# Patient Record
Sex: Female | Born: 1945 | Race: White | Hispanic: No | Marital: Married | Smoking: Never smoker
Health system: Southern US, Community
[De-identification: ages and names within clinical notes are randomized; demographics above are authoritative.]

## PROBLEM LIST (undated history)

## (undated) DIAGNOSIS — K219 Gastro-esophageal reflux disease without esophagitis: Secondary | ICD-10-CM

## (undated) DIAGNOSIS — R2681 Unsteadiness on feet: Secondary | ICD-10-CM

## (undated) DIAGNOSIS — M1990 Unspecified osteoarthritis, unspecified site: Secondary | ICD-10-CM

## (undated) DIAGNOSIS — Z9289 Personal history of other medical treatment: Secondary | ICD-10-CM

## (undated) DIAGNOSIS — E119 Type 2 diabetes mellitus without complications: Secondary | ICD-10-CM

## (undated) DIAGNOSIS — Z9989 Dependence on other enabling machines and devices: Secondary | ICD-10-CM

## (undated) DIAGNOSIS — G4733 Obstructive sleep apnea (adult) (pediatric): Secondary | ICD-10-CM

## (undated) DIAGNOSIS — F32A Depression, unspecified: Secondary | ICD-10-CM

## (undated) DIAGNOSIS — T4145XA Adverse effect of unspecified anesthetic, initial encounter: Secondary | ICD-10-CM

## (undated) DIAGNOSIS — I10 Essential (primary) hypertension: Secondary | ICD-10-CM

## (undated) DIAGNOSIS — G43909 Migraine, unspecified, not intractable, without status migrainosus: Secondary | ICD-10-CM

## (undated) DIAGNOSIS — T8859XA Other complications of anesthesia, initial encounter: Secondary | ICD-10-CM

## (undated) DIAGNOSIS — R112 Nausea with vomiting, unspecified: Secondary | ICD-10-CM

## (undated) DIAGNOSIS — F329 Major depressive disorder, single episode, unspecified: Secondary | ICD-10-CM

## (undated) DIAGNOSIS — Z9889 Other specified postprocedural states: Secondary | ICD-10-CM

## (undated) DIAGNOSIS — F419 Anxiety disorder, unspecified: Secondary | ICD-10-CM

## (undated) DIAGNOSIS — F319 Bipolar disorder, unspecified: Secondary | ICD-10-CM

## (undated) DIAGNOSIS — D649 Anemia, unspecified: Secondary | ICD-10-CM

## (undated) HISTORY — PX: TOTAL KNEE ARTHROPLASTY: SHX125

## (undated) HISTORY — PX: CARPAL TUNNEL RELEASE: SHX101

## (undated) HISTORY — PX: CATARACT EXTRACTION W/ INTRAOCULAR LENS  IMPLANT, BILATERAL: SHX1307

## (undated) HISTORY — PX: BACK SURGERY: SHX140

## (undated) HISTORY — PX: CHOLECYSTECTOMY: SHX55

---
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
## 1965-06-12 HISTORY — PX: OVARIAN CYST REMOVAL: SHX89

## 1965-11-10 HISTORY — PX: APPENDECTOMY: SHX54

## 1976-06-12 HISTORY — PX: NASAL SINUS SURGERY: SHX719

## 1990-06-12 HISTORY — PX: BLADDER SUSPENSION: SHX72

## 1998-10-21 ENCOUNTER — Other Ambulatory Visit: Admission: RE | Admit: 1998-10-21 | Discharge: 1998-10-21 | Payer: Self-pay | Admitting: Obstetrics and Gynecology

## 1998-11-01 ENCOUNTER — Emergency Department (HOSPITAL_COMMUNITY): Admission: EM | Admit: 1998-11-01 | Discharge: 1998-11-01 | Payer: Self-pay | Admitting: Emergency Medicine

## 1998-11-05 ENCOUNTER — Ambulatory Visit (HOSPITAL_COMMUNITY): Admission: RE | Admit: 1998-11-05 | Discharge: 1998-11-05 | Payer: Self-pay | Admitting: Neurosurgery

## 1998-11-05 ENCOUNTER — Encounter: Payer: Self-pay | Admitting: Neurosurgery

## 1999-02-03 ENCOUNTER — Ambulatory Visit (HOSPITAL_COMMUNITY): Admission: RE | Admit: 1999-02-03 | Discharge: 1999-02-03 | Payer: Self-pay | Admitting: Neurosurgery

## 1999-02-08 ENCOUNTER — Ambulatory Visit (HOSPITAL_COMMUNITY): Admission: RE | Admit: 1999-02-08 | Discharge: 1999-02-08 | Payer: Self-pay | Admitting: Neurosurgery

## 1999-02-08 ENCOUNTER — Encounter: Payer: Self-pay | Admitting: Neurosurgery

## 1999-02-26 ENCOUNTER — Ambulatory Visit (HOSPITAL_COMMUNITY): Admission: RE | Admit: 1999-02-26 | Discharge: 1999-02-26 | Payer: Self-pay | Admitting: Neurosurgery

## 1999-02-26 ENCOUNTER — Encounter: Payer: Self-pay | Admitting: Neurosurgery

## 1999-03-15 ENCOUNTER — Ambulatory Visit (HOSPITAL_COMMUNITY): Admission: RE | Admit: 1999-03-15 | Discharge: 1999-03-15 | Payer: Self-pay | Admitting: Neurosurgery

## 1999-03-30 ENCOUNTER — Encounter: Payer: Self-pay | Admitting: *Deleted

## 1999-03-30 ENCOUNTER — Emergency Department (HOSPITAL_COMMUNITY): Admission: EM | Admit: 1999-03-30 | Discharge: 1999-03-30 | Payer: Self-pay | Admitting: *Deleted

## 1999-03-31 ENCOUNTER — Encounter: Payer: Self-pay | Admitting: *Deleted

## 1999-05-18 ENCOUNTER — Encounter: Payer: Self-pay | Admitting: Neurosurgery

## 1999-05-18 ENCOUNTER — Ambulatory Visit (HOSPITAL_COMMUNITY): Admission: RE | Admit: 1999-05-18 | Discharge: 1999-05-18 | Payer: Self-pay | Admitting: Neurosurgery

## 1999-11-08 ENCOUNTER — Other Ambulatory Visit: Admission: RE | Admit: 1999-11-08 | Discharge: 1999-11-08 | Payer: Self-pay | Admitting: Obstetrics and Gynecology

## 2000-05-23 ENCOUNTER — Encounter: Admission: RE | Admit: 2000-05-23 | Discharge: 2000-05-23 | Payer: Self-pay

## 2000-11-15 ENCOUNTER — Other Ambulatory Visit: Admission: RE | Admit: 2000-11-15 | Discharge: 2000-11-15 | Payer: Self-pay | Admitting: Obstetrics and Gynecology

## 2001-12-03 ENCOUNTER — Other Ambulatory Visit: Admission: RE | Admit: 2001-12-03 | Discharge: 2001-12-03 | Payer: Self-pay | Admitting: Obstetrics and Gynecology

## 2005-07-20 ENCOUNTER — Emergency Department (HOSPITAL_COMMUNITY): Admission: EM | Admit: 2005-07-20 | Discharge: 2005-07-20 | Payer: Self-pay | Admitting: Emergency Medicine

## 2008-03-18 ENCOUNTER — Encounter: Admission: RE | Admit: 2008-03-18 | Discharge: 2008-03-18 | Payer: Self-pay | Admitting: Orthopedic Surgery

## 2008-09-14 ENCOUNTER — Emergency Department (HOSPITAL_COMMUNITY): Admission: EM | Admit: 2008-09-14 | Discharge: 2008-09-14 | Payer: Self-pay | Admitting: Emergency Medicine

## 2010-05-19 ENCOUNTER — Emergency Department (HOSPITAL_COMMUNITY)
Admission: EM | Admit: 2010-05-19 | Discharge: 2010-05-19 | Payer: Self-pay | Source: Home / Self Care | Admitting: Emergency Medicine

## 2010-08-23 LAB — DIFFERENTIAL
Eosinophils Absolute: 0.3 10*3/uL (ref 0.0–0.7)
Monocytes Absolute: 0.6 10*3/uL (ref 0.1–1.0)
Monocytes Relative: 6 % (ref 3–12)
Neutro Abs: 6.7 10*3/uL (ref 1.7–7.7)
Neutrophils Relative %: 70 % (ref 43–77)

## 2010-08-23 LAB — BASIC METABOLIC PANEL
CO2: 25 mEq/L (ref 19–32)
Chloride: 105 mEq/L (ref 96–112)
Creatinine, Ser: 1.08 mg/dL (ref 0.4–1.2)
Glucose, Bld: 105 mg/dL — ABNORMAL HIGH (ref 70–99)
Potassium: 4.3 mEq/L (ref 3.5–5.1)

## 2010-08-23 LAB — PROTIME-INR
INR: 0.92 (ref 0.00–1.49)
Prothrombin Time: 12.6 seconds (ref 11.6–15.2)

## 2010-08-23 LAB — CBC
HCT: 38.4 % (ref 36.0–46.0)
Hemoglobin: 12.5 g/dL (ref 12.0–15.0)
MCH: 29.8 pg (ref 26.0–34.0)
MCHC: 32.6 g/dL (ref 30.0–36.0)

## 2012-03-28 ENCOUNTER — Encounter (HOSPITAL_COMMUNITY): Payer: Self-pay | Admitting: *Deleted

## 2012-03-28 ENCOUNTER — Emergency Department (HOSPITAL_COMMUNITY): Payer: Medicare Other

## 2012-03-28 ENCOUNTER — Observation Stay (HOSPITAL_COMMUNITY)
Admission: EM | Admit: 2012-03-28 | Discharge: 2012-03-30 | Disposition: A | Discharge status: Home / Self Care | Payer: Medicare Other | Attending: Internal Medicine | Admitting: Internal Medicine

## 2012-03-28 DIAGNOSIS — K219 Gastro-esophageal reflux disease without esophagitis: Secondary | ICD-10-CM | POA: Insufficient documentation

## 2012-03-28 DIAGNOSIS — G4733 Obstructive sleep apnea (adult) (pediatric): Secondary | ICD-10-CM | POA: Insufficient documentation

## 2012-03-28 DIAGNOSIS — E119 Type 2 diabetes mellitus without complications: Secondary | ICD-10-CM | POA: Insufficient documentation

## 2012-03-28 DIAGNOSIS — E669 Obesity, unspecified: Secondary | ICD-10-CM | POA: Insufficient documentation

## 2012-03-28 DIAGNOSIS — Z9989 Dependence on other enabling machines and devices: Secondary | ICD-10-CM

## 2012-03-28 DIAGNOSIS — Z96659 Presence of unspecified artificial knee joint: Secondary | ICD-10-CM | POA: Insufficient documentation

## 2012-03-28 DIAGNOSIS — Z79899 Other long term (current) drug therapy: Secondary | ICD-10-CM | POA: Insufficient documentation

## 2012-03-28 DIAGNOSIS — I10 Essential (primary) hypertension: Secondary | ICD-10-CM | POA: Insufficient documentation

## 2012-03-28 DIAGNOSIS — R079 Chest pain, unspecified: Principal | ICD-10-CM | POA: Insufficient documentation

## 2012-03-28 DIAGNOSIS — I517 Cardiomegaly: Secondary | ICD-10-CM | POA: Diagnosis present

## 2012-03-28 DIAGNOSIS — R0602 Shortness of breath: Secondary | ICD-10-CM | POA: Insufficient documentation

## 2012-03-28 DIAGNOSIS — Z23 Encounter for immunization: Secondary | ICD-10-CM | POA: Insufficient documentation

## 2012-03-28 HISTORY — DX: Adverse effect of unspecified anesthetic, initial encounter: T41.45XA

## 2012-03-28 HISTORY — DX: Gastro-esophageal reflux disease without esophagitis: K21.9

## 2012-03-28 HISTORY — DX: Bipolar disorder, unspecified: F31.9

## 2012-03-28 HISTORY — DX: Essential (primary) hypertension: I10

## 2012-03-28 HISTORY — DX: Other complications of anesthesia, initial encounter: T88.59XA

## 2012-03-28 HISTORY — DX: Unspecified osteoarthritis, unspecified site: M19.90

## 2012-03-28 HISTORY — DX: Nausea with vomiting, unspecified: Z98.890

## 2012-03-28 HISTORY — DX: Dependence on other enabling machines and devices: Z99.89

## 2012-03-28 HISTORY — DX: Obstructive sleep apnea (adult) (pediatric): G47.33

## 2012-03-28 HISTORY — DX: Nausea with vomiting, unspecified: R11.2

## 2012-03-28 LAB — BASIC METABOLIC PANEL
Calcium: 9.8 mg/dL (ref 8.4–10.5)
GFR calc Af Amer: 83 mL/min — ABNORMAL LOW (ref >90)
GFR calc non Af Amer: 72 mL/min — ABNORMAL LOW (ref >90)
Sodium: 132 mEq/L — ABNORMAL LOW (ref 135–145)

## 2012-03-28 LAB — CBC WITH DIFFERENTIAL/PLATELET
Basophils Absolute: 0 10*3/uL (ref 0.0–0.1)
Basophils Relative: 0 % (ref 0–1)
Eosinophils Relative: 3 % (ref 0–5)
HCT: 33.6 % — ABNORMAL LOW (ref 36.0–46.0)
MCHC: 33 g/dL (ref 30.0–36.0)
Monocytes Relative: 6 % (ref 3–12)
Neutrophils Relative %: 68 % (ref 43–77)
RBC: 3.9 MIL/uL (ref 3.87–5.11)
WBC: 8.6 10*3/uL (ref 4.0–10.5)

## 2012-03-28 LAB — CK TOTAL AND CKMB (NOT AT ARMC)
CK, MB: 4.4 ng/mL — ABNORMAL HIGH (ref 0.3–4.0)
Relative Index: 3 — ABNORMAL HIGH (ref 0.0–2.5)
Total CK: 147 U/L (ref 7–177)

## 2012-03-28 MED ORDER — ASPIRIN 81 MG PO CHEW
CHEWABLE_TABLET | ORAL | Status: AC
  Administered 2012-03-28: 324 mg
  Filled 2012-03-28: qty 4

## 2012-03-28 MED ORDER — OXCARBAZEPINE 300 MG PO TABS
600.0000 mg | ORAL_TABLET | Freq: Two times a day (BID) | ORAL | Status: DC
  Administered 2012-03-28 – 2012-03-30 (×4): 600 mg via ORAL
  Filled 2012-03-28 (×6): qty 2

## 2012-03-28 MED ORDER — ENOXAPARIN SODIUM 40 MG/0.4ML ~~LOC~~ SOLN
40.0000 mg | SUBCUTANEOUS | Status: DC
  Administered 2012-03-28 – 2012-03-29 (×2): 40 mg via SUBCUTANEOUS
  Filled 2012-03-28 (×3): qty 0.4

## 2012-03-28 MED ORDER — INFLUENZA VIRUS VACC SPLIT PF IM SUSP
0.5000 mL | INTRAMUSCULAR | Status: AC
  Administered 2012-03-29: 0.5 mL via INTRAMUSCULAR
  Filled 2012-03-28: qty 0.5

## 2012-03-28 MED ORDER — LOSARTAN POTASSIUM 50 MG PO TABS
100.0000 mg | ORAL_TABLET | Freq: Every day | ORAL | Status: DC
  Administered 2012-03-29 – 2012-03-30 (×2): 100 mg via ORAL
  Filled 2012-03-28 (×2): qty 2

## 2012-03-28 MED ORDER — NITROGLYCERIN 0.4 MG SL SUBL
SUBLINGUAL_TABLET | SUBLINGUAL | Status: AC
  Administered 2012-03-28 (×2)
  Filled 2012-03-28: qty 25

## 2012-03-28 MED ORDER — MORPHINE SULFATE 2 MG/ML IJ SOLN: 1.0000 mg | INTRAMUSCULAR | Status: DC | PRN

## 2012-03-28 MED ORDER — ACETAMINOPHEN 325 MG PO TABS
650.0000 mg | ORAL_TABLET | Freq: Four times a day (QID) | ORAL | Status: DC | PRN
  Administered 2012-03-29 – 2012-03-30 (×2): 650 mg via ORAL
  Filled 2012-03-28 (×2): qty 2

## 2012-03-28 MED ORDER — SALINE SPRAY 0.65 % NA SOLN
1.0000 | NASAL | Status: DC | PRN
  Administered 2012-03-28: 1 via NASAL
  Filled 2012-03-28: qty 44

## 2012-03-28 MED ORDER — SODIUM CHLORIDE 0.9 % IV SOLN
INTRAVENOUS | Status: AC
  Administered 2012-03-28: 22:00:00 via INTRAVENOUS

## 2012-03-28 MED ORDER — SODIUM CHLORIDE 0.9 % IJ SOLN
3.0000 mL | Freq: Two times a day (BID) | INTRAMUSCULAR | Status: DC
  Administered 2012-03-28 – 2012-03-30 (×4): 3 mL via INTRAVENOUS

## 2012-03-28 MED ORDER — ONDANSETRON HCL 4 MG PO TABS: 4.0000 mg | ORAL_TABLET | Freq: Four times a day (QID) | ORAL | Status: DC | PRN

## 2012-03-28 MED ORDER — NITROGLYCERIN 0.4 MG SL SUBL: 0.4000 mg | SUBLINGUAL_TABLET | SUBLINGUAL | Status: DC | PRN

## 2012-03-28 MED ORDER — INSULIN ASPART 100 UNIT/ML ~~LOC~~ SOLN: 0.0000 [IU] | Freq: Every day | SUBCUTANEOUS | Status: DC

## 2012-03-28 MED ORDER — LORATADINE 10 MG PO TABS
10.0000 mg | ORAL_TABLET | Freq: Every day | ORAL | Status: DC
  Administered 2012-03-29 – 2012-03-30 (×2): 10 mg via ORAL
  Filled 2012-03-28 (×2): qty 1

## 2012-03-28 MED ORDER — HYDRALAZINE HCL 20 MG/ML IJ SOLN
5.0000 mg | Freq: Four times a day (QID) | INTRAMUSCULAR | Status: DC | PRN
  Administered 2012-03-28: 5 mg via INTRAVENOUS
  Filled 2012-03-28: qty 1

## 2012-03-28 MED ORDER — METFORMIN HCL ER 750 MG PO TB24
750.0000 mg | ORAL_TABLET | Freq: Two times a day (BID) | ORAL | Status: DC
  Administered 2012-03-29 – 2012-03-30 (×4): 750 mg via ORAL
  Filled 2012-03-28 (×5): qty 1

## 2012-03-28 MED ORDER — ACETAMINOPHEN 650 MG RE SUPP: 650.0000 mg | Freq: Four times a day (QID) | RECTAL | Status: DC | PRN

## 2012-03-28 MED ORDER — PAROXETINE HCL 20 MG PO TABS
40.0000 mg | ORAL_TABLET | Freq: Every day | ORAL | Status: DC
  Administered 2012-03-29 – 2012-03-30 (×2): 40 mg via ORAL
  Filled 2012-03-28 (×2): qty 2

## 2012-03-28 MED ORDER — ONDANSETRON HCL 4 MG/2ML IJ SOLN: 4.0000 mg | Freq: Four times a day (QID) | INTRAMUSCULAR | Status: DC | PRN

## 2012-03-28 MED ORDER — CLONIDINE HCL 0.1 MG PO TABS
0.1000 mg | ORAL_TABLET | Freq: Two times a day (BID) | ORAL | Status: DC | PRN
  Administered 2012-03-28: 0.1 mg via ORAL
  Filled 2012-03-28: qty 1

## 2012-03-28 MED ORDER — INSULIN ASPART 100 UNIT/ML ~~LOC~~ SOLN: 0.0000 [IU] | Freq: Three times a day (TID) | SUBCUTANEOUS | Status: DC

## 2012-03-28 MED ORDER — PAROXETINE HCL 20 MG PO TABS: 40.0000 mg | ORAL_TABLET | ORAL | Status: DC

## 2012-03-28 MED ORDER — BISOPROLOL-HYDROCHLOROTHIAZIDE 5-6.25 MG PO TABS
1.0000 | ORAL_TABLET | Freq: Every day | ORAL | Status: DC
  Administered 2012-03-29 – 2012-03-30 (×2): 1 via ORAL
  Filled 2012-03-28 (×2): qty 1

## 2012-03-28 NOTE — ED Notes (Signed)
Pt states chest pain is diminished after having NTGs. Pt states her L arm is still a little numb. Pt states "I feel relaxed."

## 2012-03-28 NOTE — H&P (Signed)
[facility] History and Physical  BIRDIA JAYCOX UJW:[id number] DOB: 30-Dec-1945 DOA: 03/28/2012  Referring physician: [place] Sheldon, ER physician PCP: Lieutenant Diego, MD  Specialists: Alphonzo Severance, [facility] cardiology  Chief Complaint: Chest pain  HPI: Yvonne Peterson is a 66 y.o. female  With past medical history diabetes, hypertension and obesity who presents to the emergency room after having an episode of chest discomfort. Patient to her knowledge, has no previous history of any coronary artery disease. She says she was driving today [place] she had severe chest pressure like an elephant was sitting on her chest. She noted also some numbness that went up to her jaw on the left side as well as her left shoulder and down her left arm. She did not note any shortness of breath or dizziness. She felt the majority pressure was across her entire chest. This happened while she was driving so she drove home and discuss with her daughter and then into the emergency room. Next  In the emergency room she was evaluated and EKG showed no signs of any ST elevation or depression, but were signs of a possible old infarct in the inferior leads. Patient was given nitroglycerin and aspirin and her chest discomfort resolved. Lab work was done which was unremarkable. She is also noted to have elevated blood pressures with systolic in the 200s. Following treatment for chest pain, her pressures came down into the 160s. Hospitalists were called for further evaluation.  Review of Systems: When I saw the patient in the emergency room, she was doing okay. She denies any headaches, vision changes, dysphasia, chest pain currently, palpitations, shortness of breath, wheeze, cough, abdominal pain, hematuria, dysuria, constipation, diarrhea, focal extremity numbness weakness or pain. Review systems otherwise negative  Past Medical History  Diagnosis Date  . Complication of anesthesia     panic attacks  . PONV  (postoperative nausea and vomiting)   . Hypertension   . Bipolar affective   . GERD (gastroesophageal reflux disease)   . Diabetes mellitus without complication     Type 2  . Arthritis    Past Surgical History  Procedure Date  . Cataract extraction     left eye - Nov. 2012, right eye - Mar. 2013  . Knee arthroplasty     Left knee - Dec. 2010, right kee - March 2011  . Cholecystectomy approx. 1989  . Bladder tac 1992  . Appendectomy June 1967  . Tonsillectomy 1955  . Nasal sinus surgery 1978  . Ovarian cyst removal 1967  . Hand surgery approx. 2000    carpal tunnel - right hand   Social History:  reports that she has never smoked. She has never used smokeless tobacco. She reports that she drinks alcohol. She reports that she does not use illicit drugs. Patient lives at home with her husband. She is able to state and just about all activities of daily living  Allergies  Allergen Reactions  . Sulfa Antibiotics Anaphylaxis and Swelling    Swelling in nasal passage    Family history: Mother and father both had heart disease  Prior to Admission medications   Medication Sig Start Date End Date Taking? Authorizing Provider  bisoprolol-hydrochlorothiazide (ZIAC) 5-6.25 MG per tablet Take 1 tablet by mouth daily.   Yes Historical Provider, MD  cetirizine (ZYRTEC) 10 MG tablet Take 10 mg by mouth daily.   Yes Historical Provider, MD  losartan (COZAAR) 100 MG tablet Take 100 mg by mouth daily.   Yes Historical Provider, MD  metFORMIN (GLUCOPHAGE-XR) 750 MG 24 hr tablet Take 750 mg by mouth 2 (two) times daily.   Yes Historical Provider, MD  oxcarbazepine (TRILEPTAL) 600 MG tablet Take 600 mg by mouth 2 (two) times daily.   Yes Historical Provider, MD  PARoxetine (PAXIL) 40 MG tablet Take 40 mg by mouth every morning.   Yes Historical Provider, MD   Physical Exam: Filed Vitals:   03/28/12 1615 03/28/12 1622 03/28/12 1633 03/28/12 1918  BP: 208/95 191/101 156/84 163/97  Pulse:  75   66  Temp:  98.6 F (37 C)  98.7 F (37.1 C)  TempSrc:  Oral  Oral  Resp:  16  20  Height:  5\' 4"  (1.626 m)    Weight:  108.863 kg (240 lb)    SpO2:  97%  99%     General:  Alert and oriented x3, no acute distress, looks younger than stated age, fatigued  Eyes: Sclera nonicteric, extraocular movements are intact  ENT: Normocephalic the major medical mucous membranes are moist  Neck: Supple no JVD  Cardiovascular: Regular rate and rhythm, S1-S2, no appreciable murmurs rubs or gallops  Respiratory: Clear to auscultation bilaterally  Abdomen: Soft, obese, nontender, positive bowel sounds  Skin: No skin breaks, tears or lesions  Musculoskeletal: No clubbing or cyanosis or edema  Psychiatric: Patient is appropriate, no evidence of psychoses  Neurologic: No obvious deficits  Labs on Admission:  Basic Metabolic Panel:  Lab 03/28/12 4098  NA 132*  K 3.8  CL 95*  CO2 26  GLUCOSE 132*  BUN 18  CREATININE 0.82  CALCIUM 9.8  MG --  PHOS --   CBC:  Lab 03/28/12 1735  WBC 8.6  NEUTROABS 5.8  HGB 11.1*  HCT 33.6*  MCV 86.2  PLT 242   Cardiac Enzymes:  Lab 03/28/12 1735  CKTOTAL --  CKMB --  CKMBINDEX --  TROPONINI <0.30    BNP (last 3 results)  Basename 03/28/12 1735  PROBNP 67.8    Radiological Exams on Admission: Dg Chest Port 1 View  03/28/2012   IMPRESSION: Stable cardiomegaly.  No active lung disease.   Original Report Authenticated By: Danae Orleans, M.D.     EKG: Independently reviewed. 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 We'll cycle enzymes x3, lipid profile in the morning. I've contacted [facility] cardiology who will see the patient the morning for possible stress test or other.  Active Problems:  Diabetes mellitus type II, controlled: Continue metformin plus sliding-scale.  We will check an A1c   Malignant hypertension: Mildly elevated now. Continue home meds plus when necessary clonidine   Cardiomegaly: BNP normal on admission. We'll defer to cardiology about checking echo.   Obesity: Stable. Counseled.  Code Status: Full code  Family Communication: Case discussed with patient and her husband who is present at the bedside  Disposition Plan: Possible discharge home tomorrow after the evaluation  Time spent: 30 minutes  Hollice Espy [facility] Pager [phone]  If 7PM-7AM, please contact night-coverage [email or web address] Password [email or web address] 03/28/2012, 8:02 PM

## 2012-03-28 NOTE — ED Notes (Signed)
Bed:WA13<BR> Expected date:<BR> Expected time:<BR> Means of arrival:<BR> Comments:<BR> Triage 4 

## 2012-03-28 NOTE — ED Notes (Signed)
X-ray at bedside

## 2012-03-28 NOTE — ED Provider Notes (Signed)
History     CSN: [id number]  Arrival date & time 03/28/12  1601   First MD Initiated Contact with Patient 03/28/12 1719      Chief Complaint  Patient presents with  . Chest Pain  . Shortness of Breath    (Consider location/radiation/quality/duration/timing/severity/associated sxs/prior treatment) HPI Pt with no known CAD reports she had an episode of jaw pain, arm numbness and heaviness across her chest a short time prior to arrival. Associated with SOB. On arrival found to be hypertensive. Given ASA and NTG in triage with relief of pain. She is pain free now. She reports a similar episode about a week ago, also associated with HTN and worse with exertion that resolved spontaneously.   History reviewed. No pertinent past medical history.  Past Surgical History  Procedure Date  . Cataract extraction   . Cholecystectomy   . Knee arthroplasty     No family history on file.  History  Substance Use Topics  . Smoking status: Never Smoker   . Smokeless tobacco: Never Used  . Alcohol Use: No    OB History    Grav Para Term Preterm Abortions TAB SAB Ect Mult Living                  Review of Systems All other systems reviewed and are negative except as noted in HPI.   Allergies  Sulfa antibiotics  Home Medications   Current Outpatient Rx  Name Route Sig Dispense Refill  . BISOPROLOL-HYDROCHLOROTHIAZIDE 5-6.25 MG PO TABS Oral Take 1 tablet by mouth daily.    Marland Kitchen CETIRIZINE HCL 10 MG PO TABS Oral Take 10 mg by mouth daily.    Marland Kitchen LOSARTAN POTASSIUM 100 MG PO TABS Oral Take 100 mg by mouth daily.    Marland Kitchen METFORMIN HCL ER 750 MG PO TB24 Oral Take 750 mg by mouth 2 (two) times daily.    Marland Kitchen OXCARBAZEPINE 600 MG PO TABS Oral Take 600 mg by mouth 2 (two) times daily.    Marland Kitchen PAROXETINE HCL 40 MG PO TABS Oral Take 40 mg by mouth every morning.      BP 156/84  Pulse 75  Temp 98.6 F (37 C) (Oral)  Resp 16  Ht 5\' 4"  (1.626 m)  Wt 240 lb (108.863 kg)  BMI 41.20 kg/m2  SpO2  97%  Physical Exam  Nursing note and vitals reviewed. Constitutional: She is oriented to person, place, and time. She appears well-developed and well-nourished.  HENT:  Head: Normocephalic and atraumatic.  Eyes: EOM are normal. Pupils are equal, round, and reactive to light.  Neck: Normal range of motion. Neck supple.  Cardiovascular: Normal rate, normal heart sounds and intact distal pulses.   Pulmonary/Chest: Effort normal and breath sounds normal.  Abdominal: Bowel sounds are normal. She exhibits no distension. There is no tenderness.  Musculoskeletal: Normal range of motion. She exhibits no edema and no tenderness.  Neurological: She is alert and oriented to person, place, and time. She has normal strength. No cranial nerve deficit or sensory deficit.  Skin: Skin is warm and dry. No rash noted.  Psychiatric: She has a normal mood and affect.    ED Course  Procedures (including critical care time)  Labs Reviewed  CBC WITH DIFFERENTIAL - Abnormal; Notable for the following:    Hemoglobin 11.1 (*)     HCT 33.6 (*)     All other components within normal limits  BASIC METABOLIC PANEL - Abnormal; Notable for the following:  Sodium 132 (*)     Chloride 95 (*)     Glucose, Bld 132 (*)     GFR calc non Af Amer 72 (*)     GFR calc Af Amer 83 (*)     All other components within normal limits  TROPONIN I  PRO B NATRIURETIC PEPTIDE   Dg Chest Port 1 View  03/28/2012  *RADIOLOGY REPORT*  Clinical Data: Chest pain and shortness of breath.  PORTABLE CHEST - 1 VIEW  Comparison: 05/19/2010  Findings: Cardiomegaly is stable.  Both lungs are clear.  No evidence of pleural effusion.  IMPRESSION: Stable cardiomegaly.  No active lung disease.   Original Report Authenticated By: Danae Orleans, M.D.      No diagnosis found.    MDM   Date: 03/28/2012  Rate: 72  Rhythm: normal sinus rhythm  QRS Axis: normal  Intervals: normal  ST/T Wave abnormalities: nonspecific T wave changes   Conduction Disutrbances:none  Narrative Interpretation: inferior Q waves  Old EKG Reviewed: unchanged   Labs and imaging here unremarkable. Symptoms concerning for ACS. Admit for further eval.        Savahna Casados B. Bernette Mayers, MD 03/29/12 1346

## 2012-03-28 NOTE — ED Notes (Signed)
Pt from work with c/o chest pain that radiates to jaw and left arm and shortness of breath. sts she was at work when chest pain began. Sts she has had heaviness in her chest earlier this week as well associated with shob as well.

## 2012-03-28 NOTE — ED Notes (Signed)
Called to give report nurse unavailable will call back.  

## 2012-03-29 ENCOUNTER — Encounter (HOSPITAL_COMMUNITY): Payer: Self-pay | Admitting: *Deleted

## 2012-03-29 DIAGNOSIS — G4733 Obstructive sleep apnea (adult) (pediatric): Secondary | ICD-10-CM

## 2012-03-29 HISTORY — DX: Obstructive sleep apnea (adult) (pediatric): G47.33

## 2012-03-29 LAB — LIPID PANEL
HDL: 50 mg/dL (ref >39)
LDL Cholesterol: 92 mg/dL (ref 0–99)
Total CHOL/HDL Ratio: 3.4 RATIO
Triglycerides: 148 mg/dL (ref <150)
VLDL: 30 mg/dL (ref 0–40)

## 2012-03-29 LAB — GLUCOSE, CAPILLARY
Glucose-Capillary: 120 mg/dL — ABNORMAL HIGH (ref 70–99)
Glucose-Capillary: 106 mg/dL — ABNORMAL HIGH (ref 70–99)
Glucose-Capillary: 112 mg/dL — ABNORMAL HIGH (ref 70–99)

## 2012-03-29 LAB — CK TOTAL AND CKMB (NOT AT ARMC): Relative Index: 3.8 — ABNORMAL HIGH (ref 0.0–2.5)

## 2012-03-29 MED ORDER — HYDROCODONE-ACETAMINOPHEN 5-500 MG PO TABS: 1.0000 | ORAL_TABLET | Freq: Four times a day (QID) | ORAL | Status: DC | PRN

## 2012-03-29 MED ORDER — ISOSORBIDE MONONITRATE ER 30 MG PO TB24: 30.0000 mg | ORAL_TABLET | Freq: Every day | ORAL | Status: DC

## 2012-03-29 MED ORDER — ISOSORBIDE MONONITRATE ER 30 MG PO TB24
30.0000 mg | ORAL_TABLET | Freq: Every day | ORAL | Status: DC
  Administered 2012-03-29 – 2012-03-30 (×2): 30 mg via ORAL
  Filled 2012-03-29 (×2): qty 1

## 2012-03-29 NOTE — Care Management Note (Unsigned)
    Page 1 of 1   03/29/2012     2:00:23 PM   CARE MANAGEMENT NOTE 03/29/2012  Patient:  Yvonne Peterson, Yvonne Peterson   Account Number:  [id number]  Date Initiated:  03/29/2012  Documentation initiated by:  Lanier Clam  Subjective/Objective Assessment:   ADMITTED W/CHEST PAIN.     Action/Plan:   FROM HOME   Anticipated DC Date:  03/29/2012   Anticipated DC Plan:  HOME/SELF CARE      DC Planning Services  CM consult      Choice offered to / List presented to:             Status of service:  In process, will continue to follow Medicare Important Message given?   (If response is "NO", the following Medicare IM given date fields will be blank) Date Medicare IM given:   Date Additional Medicare IM given:    Discharge Disposition:    Per UR Regulation:  Reviewed for med. necessity/level of care/duration of stay  If discussed at Long Length of Stay Meetings, dates discussed:    Comments:  03/29/12 [facility] Katheen Aslin RN,BSN NCM [phone]

## 2012-03-29 NOTE — Consult Note (Signed)
Reason for Consult:chest pain in pt with diabetes  Referring Physician: Dr. Rito Ehrlich   PCP: Dr. Lieutenant Diego TERILYN Peterson is an 66 y.o. female.    Chief Complaint:  Chest pain  HPI: Yvonne Peterson is a 66 y.o. female with past medical history of diabetes, hypertension and obesity who presented to the emergency room after having an episode of chest discomfort. Patient to her knowledge, has no previous history of any coronary artery disease. She says she was driving yesterday, she had severe chest pressure like an elephant was sitting on her chest. She noted also some numbness that went up to her jaw on the left side as well as her left shoulder and down her left arm and across to her Rt. Arm. She did not note any shortness of breath or dizziness. She felt the majority pressure was across her entire chest. This happened while she was driving so she drove home and discussed with her daughter and then into the emergency room.  Last week she had a feeling like a boulder was sitting on her chest.  It did ease.  Recently with ambulation she has been DOE and would have to stop and rest.  In the emergency room she was evaluated and EKG showed no signs of any ST elevation or depression, but were signs of a possible old infarct in the inferior leads. Patient was given nitroglycerin and aspirin and her chest discomfort resolved. Lab work was done which was unremarkable. She is also noted to have elevated blood pressures with systolic in the 200s. Following treatment for chest pain, her pressures came down into the 160s. Hospitalists admitted the pt. To rule out MI.    Currently cardiac enzymes including troponin I is negative.  EKG Sinus brady with deep Q wave in III, deep twave in aVR that is unchanged from last pm.  No old EKGs to compare.   Last episode of chest discomfort was around 4-5 AM today, mild resolved quickly.  Pt. Does not prefer to go to [facility] would prefer [facility] where her husband is a  patient.  She does not have an appointment currently.  She would like to go home.  When asked if she could walk in the hall she said she had walked to the bathroom, but if she walked in the hall she would be SOB.  Past Medical History  Diagnosis Date  . Complication of anesthesia     panic attacks  . PONV (postoperative nausea and vomiting)   . Hypertension   . Bipolar affective   . GERD (gastroesophageal reflux disease)   . Diabetes mellitus without complication     Type 2  . Arthritis   . Sleep apnea   . OSA on CPAP 03/29/2012    Past Surgical History  Procedure Date  . Cataract extraction     left eye - Nov. 2012, right eye - Mar. 2013  . Knee arthroplasty     Left knee - Dec. 2010, right kee - March 2011  . Cholecystectomy approx. 1989  . Bladder tac 1992  . Appendectomy June 1967  . Tonsillectomy 1955  . Nasal sinus surgery 1978  . Ovarian cyst removal 1967  . Hand surgery approx. 2000    carpal tunnel - right hand    Family History  Problem Relation Age of Onset  . Coronary artery disease Mother   . Heart failure Mother   . Coronary artery disease Father   . Cancer Father   .  Cancer Brother    Social History:  reports that she has never smoked. She has never used smokeless tobacco. She reports that she drinks alcohol. She reports that she does not use illicit drugs. Married for 21 yrs.  Works outside the home.  Allergies:  Allergies  Allergen Reactions  . Sulfa Antibiotics Anaphylaxis and Swelling    Swelling in nasal passage    Medications Prior to Admission  Medication Sig Dispense Refill  . bisoprolol-hydrochlorothiazide (ZIAC) 5-6.25 MG per tablet Take 1 tablet by mouth daily.      . cetirizine (ZYRTEC) 10 MG tablet Take 10 mg by mouth daily.      Marland Kitchen losartan (COZAAR) 100 MG tablet Take 100 mg by mouth daily.      . metFORMIN (GLUCOPHAGE-XR) 750 MG 24 hr tablet Take 750 mg by mouth 2 (two) times daily.      Marland Kitchen oxcarbazepine (TRILEPTAL) 600 MG tablet  Take 600 mg by mouth 2 (two) times daily.      Marland Kitchen PARoxetine (PAXIL) 40 MG tablet Take 40 mg by mouth every morning.        Results for orders placed during the hospital encounter of 03/28/12 (from the past 48 hour(s))  CBC WITH DIFFERENTIAL     Status: Abnormal   Collection Time   03/28/12  5:35 PM      Component Value Range Comment   WBC 8.6  4.0 - 10.5 K/uL    RBC 3.90  3.87 - 5.11 MIL/uL    Hemoglobin 11.1 (*) 12.0 - 15.0 g/dL    HCT 16.1 (*) 09.6 - 46.0 %    MCV 86.2  78.0 - 100.0 fL    MCH 28.5  26.0 - 34.0 pg    MCHC 33.0  30.0 - 36.0 g/dL    RDW 04.5  40.9 - 81.1 %    Platelets 242  150 - 400 K/uL    Neutrophils Relative 68  43 - 77 %    Neutro Abs 5.8  1.7 - 7.7 K/uL    Lymphocytes Relative 23  12 - 46 %    Lymphs Abs 2.0  0.7 - 4.0 K/uL    Monocytes Relative 6  3 - 12 %    Monocytes Absolute 0.5  0.1 - 1.0 K/uL    Eosinophils Relative 3  0 - 5 %    Eosinophils Absolute 0.2  0.0 - 0.7 K/uL    Basophils Relative 0  0 - 1 %    Basophils Absolute 0.0  0.0 - 0.1 K/uL   BASIC METABOLIC PANEL     Status: Abnormal   Collection Time   03/28/12  5:35 PM      Component Value Range Comment   Sodium 132 (*) 135 - 145 mEq/L    Potassium 3.8  3.5 - 5.1 mEq/L    Chloride 95 (*) 96 - 112 mEq/L    CO2 26  19 - 32 mEq/L    Glucose, Bld 132 (*) 70 - 99 mg/dL    BUN 18  6 - 23 mg/dL    Creatinine, Ser 9.14  0.50 - 1.10 mg/dL    Calcium 9.8  8.4 - 78.2 mg/dL    GFR calc non Af Amer 72 (*) >90 mL/min    GFR calc Af Amer 83 (*) >90 mL/min   TROPONIN I     Status: Normal   Collection Time   03/28/12  5:35 PM      Component Value Range Comment  Troponin I <0.30  <0.30 ng/mL   PRO B NATRIURETIC PEPTIDE     Status: Normal   Collection Time   03/28/12  5:35 PM      Component Value Range Comment   Pro B Natriuretic peptide (BNP) 67.8  0 - 125 pg/mL   HEMOGLOBIN A1C     Status: Abnormal   Collection Time   03/28/12  5:35 PM      Component Value Range Comment   Hemoglobin A1C 7.0  (*) <5.7 %    Mean Plasma Glucose 154 (*) <117 mg/dL   GLUCOSE, CAPILLARY     Status: Normal   Collection Time   03/28/12  9:54 PM      Component Value Range Comment   Glucose-Capillary 89  70 - 99 mg/dL   CK TOTAL AND CKMB     Status: Abnormal   Collection Time   03/28/12 10:35 PM      Component Value Range Comment   Total CK 147  7 - 177 U/L    CK, MB 4.4 (*) 0.3 - 4.0 ng/mL    Relative Index 3.0 (*) 0.0 - 2.5   CK TOTAL AND CKMB     Status: Abnormal   Collection Time   03/29/12  6:05 AM      Component Value Range Comment   Total CK 103  7 - 177 U/L    CK, MB 3.9  0.3 - 4.0 ng/mL    Relative Index 3.8 (*) 0.0 - 2.5   LIPID PANEL     Status: Normal   Collection Time   03/29/12  6:05 AM      Component Value Range Comment   Cholesterol 172  0 - 200 mg/dL    Triglycerides 960  <454 mg/dL    HDL 50  >09 mg/dL    Total CHOL/HDL Ratio 3.4      VLDL 30  0 - 40 mg/dL    LDL Cholesterol 92  0 - 99 mg/dL   GLUCOSE, CAPILLARY     Status: Abnormal   Collection Time   03/29/12  8:06 AM      Component Value Range Comment   Glucose-Capillary 120 (*) 70 - 99 mg/dL   TROPONIN I     Status: Normal   Collection Time   03/29/12 12:00 PM      Component Value Range Comment   Troponin I <0.30  <0.30 ng/mL   GLUCOSE, CAPILLARY     Status: Abnormal   Collection Time   03/29/12 12:13 PM      Component Value Range Comment   Glucose-Capillary 106 (*) 70 - 99 mg/dL   CK TOTAL AND CKMB     Status: Abnormal   Collection Time   03/29/12  1:37 PM      Component Value Range Comment   Total CK 100  7 - 177 U/L    CK, MB 3.8  0.3 - 4.0 ng/mL    Relative Index 3.8 (*) 0.0 - 2.5    Dg Chest Port 1 View  03/28/2012  *RADIOLOGY REPORT*  Clinical Data: Chest pain and shortness of breath.  PORTABLE CHEST - 1 VIEW  Comparison: 05/19/2010  Findings: Cardiomegaly is stable.  Both lungs are clear.  No evidence of pleural effusion.  IMPRESSION: Stable cardiomegaly.  No active lung disease.   Original  Report Authenticated By: Danae Orleans, M.D.     ROS: General:no colds or fevers Skin:no rashes HEENT:no blurred vision CV:see HPI PUL:DOE  GI:no diarrhea or constipation no melena GU:no hematuria MS:no joint pain Neuro:no syncope  Endo:+ DM, pretty well controlled.   Blood pressure 126/82, pulse 93, temperature 98.9 F (37.2 C), temperature source Oral, resp. rate 18, height 5\' 4"  (1.626 m), weight 111.9 kg (246 lb 11.1 oz), SpO2 96.00%. PE: General:alert and oriented X 3, MAE, follows commands, pleasant affect Skin:Warm and dry, brisk capillary refill HEENT:normocephalic, sclarea clear Neck:supple no JVD, no bruits Heart:S1S2 RRR no obvious murmur, gallup or rub Lungs:clear without rales, rhonchi or wheezes Abd:+ BS, soft non tender Ext:no edema, 2 + pedal pulses bil Neuro:alert and oriented X 3, MAE, follows commands    Assessment/Plan Principal Problem:  *Chest pain Active Problems:  Diabetes mellitus type II, controlled  Malignant hypertension  Cardiomegaly  Obesity  OSA on CPAP  PLAN: Pt with angina, and multiple risk factors for CAD with DM, HTN, family history, obesitiy, sleep apnea and history of elevated cholesterol.  NTG and ASA resolved her chest pain.  Several years ago she was pt. Of Dr. Lucas Mallow he did do stress test several years ago.   Now with ongoing DOE and no CHF, possible anginal equivalent as well.  Cardiomegaly on CXR is stable. PCP's office is closed-unable to have old EKG.  Is on BB, add Imdur 30 mg daily.   Would like pt. To stay to discuss with Dr. Allyson Sabal.   INGOLD,LAURA R 03/29/2012, 3:38 PM   I have seen and examined the patient along with [facility] R, NP.  I have reviewed the chart, notes and new data.  I agree with NP's note.  Her symptoms are highly compelling for unstable angina. 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 Her enzymes are low risk. Her risk factor profile is high risk  PLAN: Recommend nuclear scintigraphy  (lexiscan, not treadmill). If abnormal or non-diagnostic, low threshold for coronary angiography. The procedure was discussed in detail.  Thurmon Fair, MD, [facility] [phone] 03/29/2012, 6:06 PM

## 2012-03-29 NOTE — Progress Notes (Signed)
Patient ID: Yvonne Peterson, female   DOB: 06-25-1945, 66 y.o.   MRN: [id number]  [facility] PROGRESS NOTE  LAKYSHA KOSSMAN WUJ:[id number] DOB: 1945/08/15 DOA: 03/28/2012 PCP: Lieutenant Diego, MD  Principal Problem:  *Chest pain - unclear etiology at this time but given risk factors will likely need stress test - will follow up on cardiology recommendations - continue to monitor on telemetry floor - provide supportive care  Active Problems:  Diabetes mellitus type II, controlled - trend CBG and readjust the regimen as indicated  Malignant hypertension - continue home medication regimen - added Imdur  Obesity - nutrition consultation obtained by me  OSA on CPAP - continue as per home regimen   Consultants:  Cardiology  Procedures/Studies: Dg Chest Port 1 View  03/28/2012  *RADIOLOGY REPORT*  Clinical Data: Chest pain and shortness of breath.  PORTABLE CHEST - 1 VIEW  Comparison: 05/19/2010  Findings: Cardiomegaly is stable.  Both lungs are clear.  No evidence of pleural effusion.  IMPRESSION: Stable cardiomegaly.  No active lung disease.   Original Report Authenticated By: Danae Orleans, M.D.     Antibiotics:  None  Code Status: Full Family Communication: Pt at bedside Disposition Plan: Home when medically stable  HPI/Subjective: No events overnight.   Objective: Filed Vitals:   03/29/12 0040 03/29/12 0530 03/29/12 1401 03/29/12 1424  BP:  133/67 126/54 126/82  Pulse: 68 55 62 93  Temp:  97.5 F (36.4 C) 97.9 F (36.6 C) 98.9 F (37.2 C)  TempSrc:  Oral Oral Oral  Resp: 18 16 18    Height:      Weight:      SpO2: 95% 96% 94% 96%    Intake/Output Summary (Last 24 hours) at 03/29/12 1808 Last data filed at 03/29/12 1719  Gross per 24 hour  Intake 668.67 ml  Output      0 ml  Net 668.67 ml    Exam:   General:  Pt is alert, follows commands appropriately, not in acute distress  Cardiovascular: Regular rate and rhythm, S1/S2, no murmurs, no rubs, no  gallops  Respiratory: Clear to auscultation bilaterally, no wheezing, no crackles, no rhonchi  Abdomen: Soft, non tender, non distended, bowel sounds present, no guarding  Extremities: No edema, pulses DP and PT palpable bilaterally  Neuro: Grossly nonfocal  Data Reviewed: Basic Metabolic Panel:  Lab 03/28/12 9562  NA 132*  K 3.8  CL 95*  CO2 26  GLUCOSE 132*  BUN 18  CREATININE 0.82  CALCIUM 9.8  MG --  PHOS --   Liver Function Tests: No results found for this basename: AST:5,ALT:5,ALKPHOS:5,BILITOT:5,PROT:5,ALBUMIN:5 in the last 168 hours No results found for this basename: LIPASE:5,AMYLASE:5 in the last 168 hours No results found for this basename: AMMONIA:5 in the last 168 hours CBC:  Lab 03/28/12 1735  WBC 8.6  NEUTROABS 5.8  HGB 11.1*  HCT 33.6*  MCV 86.2  PLT 242   Cardiac Enzymes:  Lab 03/29/12 1337 03/29/12 1200 03/29/12 0605 03/28/12 2235 03/28/12 1735  CKTOTAL 100 -- 103 147 --  CKMB 3.8 -- 3.9 4.4* --  CKMBINDEX -- -- -- -- --  TROPONINI -- <0.30 -- -- <0.30   BNP: No components found with this basename: POCBNP:5 CBG:  Lab 03/29/12 1213 03/29/12 0806 03/28/12 2154  GLUCAP 106* 120* 89    No results found for this or any previous visit (from the past 240 hour(s)).   Scheduled Meds:   . sodium chloride   Intravenous STAT  .  bisoprolol-hydrochlorothiazide  1 tablet Oral Daily  . enoxaparin (LOVENOX) injection  40 mg Subcutaneous Q24H  . influenza  inactive virus vaccine  0.5 mL Intramuscular Tomorrow-1000  . insulin aspart  0-5 Units Subcutaneous QHS  . insulin aspart  0-9 Units Subcutaneous TID WC  . isosorbide mononitrate  30 mg Oral Daily  . loratadine  10 mg Oral Daily  . losartan  100 mg Oral Daily  . metFORMIN  750 mg Oral BID WC  . oxcarbazepine  600 mg Oral BID  . PARoxetine  40 mg Oral Daily  . sodium chloride  3 mL Intravenous Q12H  . DISCONTD: PARoxetine  40 mg Oral BH-q7a   Continuous Infusions:    Debbora Presto,  MD  [facility] Pager [phone]  If 7PM-7AM, please contact night-coverage [email or web address] Password [email or web address] 03/29/2012, 6:08 PM   LOS: 1 day

## 2012-03-29 NOTE — Progress Notes (Signed)
Patient has sleep apnea and wears a CPAP machine at home. Before going to bed, respiratory put her on CPAP. Pt's oxygen dropped to 80 but when RN prompted patient to take a deep breath, O2 went back up to 95. RN will continue to monitor pt closely and she is sleeping in bed right now.

## 2012-03-30 ENCOUNTER — Observation Stay (HOSPITAL_COMMUNITY): Payer: Medicare Other

## 2012-03-30 LAB — BASIC METABOLIC PANEL WITH GFR
BUN: 21 mg/dL (ref 6–23)
CO2: 27 meq/L (ref 19–32)
Calcium: 9.1 mg/dL (ref 8.4–10.5)
Chloride: 95 meq/L — ABNORMAL LOW (ref 96–112)
Creatinine, Ser: 1.03 mg/dL (ref 0.50–1.10)
GFR calc Af Amer: 63 mL/min — ABNORMAL LOW (ref >90)
GFR calc non Af Amer: 55 mL/min — ABNORMAL LOW (ref >90)
Glucose, Bld: 130 mg/dL — ABNORMAL HIGH (ref 70–99)
Potassium: 4.7 meq/L (ref 3.5–5.1)
Sodium: 130 meq/L — ABNORMAL LOW (ref 135–145)

## 2012-03-30 LAB — CBC
HCT: 30.6 % — ABNORMAL LOW (ref 36.0–46.0)
Hemoglobin: 10.2 g/dL — ABNORMAL LOW (ref 12.0–15.0)
MCH: 28.7 pg (ref 26.0–34.0)
MCHC: 33.3 g/dL (ref 30.0–36.0)
MCV: 86 fL (ref 78.0–100.0)
Platelets: 236 K/uL (ref 150–400)
RBC: 3.56 MIL/uL — ABNORMAL LOW (ref 3.87–5.11)
RDW: 14.8 % (ref 11.5–15.5)
WBC: 8.8 K/uL (ref 4.0–10.5)

## 2012-03-30 LAB — GLUCOSE, CAPILLARY
Glucose-Capillary: 111 mg/dL — ABNORMAL HIGH (ref 70–99)
Glucose-Capillary: 103 mg/dL — ABNORMAL HIGH (ref 70–99)
Glucose-Capillary: 105 mg/dL — ABNORMAL HIGH (ref 70–99)

## 2012-03-30 MED ORDER — TECHNETIUM TC 99M SESTAMIBI GENERIC - CARDIOLITE
30.0000 | Freq: Once | INTRAVENOUS | Status: AC | PRN
  Administered 2012-03-30: 30 via INTRAVENOUS

## 2012-03-30 MED ORDER — TECHNETIUM TC 99M SESTAMIBI GENERIC - CARDIOLITE
10.0000 | Freq: Once | INTRAVENOUS | Status: AC | PRN
  Administered 2012-03-30: 10 via INTRAVENOUS

## 2012-03-30 MED ORDER — REGADENOSON 0.4 MG/5ML IV SOLN
0.4000 mg | Freq: Once | INTRAVENOUS | Status: AC
  Administered 2012-03-30: 0.4 mg via INTRAVENOUS
  Filled 2012-03-30: qty 5

## 2012-03-30 MED ORDER — HYDRALAZINE HCL 20 MG/ML IJ SOLN
10.0000 mg | Freq: Once | INTRAMUSCULAR | Status: AC
  Administered 2012-03-30: 10 mg via INTRAVENOUS

## 2012-03-30 NOTE — Progress Notes (Signed)
Pt sent to [place] via care link for stress test.

## 2012-03-30 NOTE — Progress Notes (Signed)
[facility]  Subjective: CP resolved During Lexiscan:  Felt relaxed with neck pain  Objective: Vital signs in last 24 hours: Temp:  [97.7 F (36.5 C)-98.9 F (37.2 C)] 97.7 F (36.5 C) (10/19 0511) Pulse Rate:  [52-93] 73  (10/19 1029) Resp:  [17-18] 17  (10/19 0900) BP: (118-209)/(54-82) 177/74 mmHg (10/19 1029) SpO2:  [94 %-96 %] 96 % (10/19 0511) Last BM Date: 03/28/12  Intake/Output from previous day: 10/18 0701 - 10/19 0700 In: 480 [P.O.:480] Out: -  Intake/Output this shift:    Medications Current Facility-Administered Medications  Medication Dose Route Frequency Provider Last Rate Last Dose  . acetaminophen (TYLENOL) tablet 650 mg  650 mg Oral Q6H PRN Hollice Espy, MD   650 mg at 03/29/12 1948   Or  . acetaminophen (TYLENOL) suppository 650 mg  650 mg Rectal Q6H PRN Hollice Espy, MD      . bisoprolol-hydrochlorothiazide [facility]) 5-6.25 MG per tablet 1 tablet  1 tablet Oral Daily Hollice Espy, MD   1 tablet at 03/29/12 1059  . cloNIDine (CATAPRES) tablet 0.1 mg  0.1 mg Oral BID PRN Hollice Espy, MD   0.1 mg at 03/28/12 2319  . enoxaparin (LOVENOX) injection 40 mg  40 mg Subcutaneous Q24H Hollice Espy, MD   40 mg at 03/29/12 2151  . hydrALAZINE (APRESOLINE) injection 10 mg  10 mg Intravenous Once Wilburt Finlay, PA      . hydrALAZINE (APRESOLINE) injection 5 mg  5 mg Intravenous Q6H PRN Rolan Lipa, NP   5 mg at 03/28/12 2227  . influenza  inactive virus vaccine (FLUZONE/FLUARIX) injection 0.5 mL  0.5 mL Intramuscular Tomorrow-1000 Charles B. Bernette Mayers, MD   0.5 mL at 03/29/12 1105  . insulin aspart (novoLOG) injection 0-5 Units  0-5 Units Subcutaneous QHS Hollice Espy, MD      . insulin aspart (novoLOG) injection 0-9 Units  0-9 Units Subcutaneous TID WC Hollice Espy, MD      . isosorbide mononitrate (IMDUR) 24 hr tablet 30 mg  30 mg Oral Daily Nada Boozer, NP   30 mg at 03/29/12 1634  . loratadine  (CLARITIN) tablet 10 mg  10 mg Oral Daily Hollice Espy, MD   10 mg at 03/29/12 1058  . losartan (COZAAR) tablet 100 mg  100 mg Oral Daily Hollice Espy, MD   100 mg at 03/29/12 1058  . metFORMIN (GLUCOPHAGE-XR) 24 hr tablet 750 mg  750 mg Oral BID WC Hollice Espy, MD   750 mg at 03/29/12 1634  . morphine 2 MG/ML injection 1 mg  1 mg Intravenous Q3H PRN Hollice Espy, MD      . nitroGLYCERIN (NITROSTAT) SL tablet 0.4 mg  0.4 mg Sublingual Q5 Min x 3 PRN Hollice Espy, MD      . ondansetron [facility]) tablet 4 mg  4 mg Oral Q6H PRN Hollice Espy, MD       Or  . ondansetron (ZOFRAN) injection 4 mg  4 mg Intravenous Q6H PRN Hollice Espy, MD      . Oxcarbazepine (TRILEPTAL) tablet 600 mg  600 mg Oral BID Hollice Espy, MD   600 mg at 03/29/12 2151  . PARoxetine (PAXIL) tablet 40 mg  40 mg Oral Daily Hollice Espy, MD   40 mg at 03/29/12 1058  . regadenoson (LEXISCAN) injection SOLN 0.4 mg  0.4 mg Intravenous Once Thurmon Fair, MD   0.4  mg at 03/30/12 1019  . sodium chloride (OCEAN) 0.65 % nasal spray 1 spray  1 spray Each Nare PRN Rolan Lipa, NP   1 spray at 03/28/12 2310  . sodium chloride 0.9 % injection 3 mL  3 mL Intravenous Q12H Hollice Espy, MD   3 mL at 03/29/12 2151    PE: General appearance: alert, cooperative and no distress Lungs: clear to auscultation bilaterally Heart: regular rate and rhythm, S1, S2 normal, no murmur, click, rub or gallop Extremities: No LEE Pulses: 2+ and symmetric Skin: Warm and Dry Neurologic: Grossly normal  Lab Results:   Basename 03/30/12 0505 03/28/12 1735  WBC 8.8 8.6  HGB 10.2* 11.1*  HCT 30.6* 33.6*  PLT 236 242   BMET  Basename 03/30/12 0505 03/28/12 1735  NA 130* 132*  K 4.7 3.8  CL 95* 95*  CO2 27 26  GLUCOSE 130* 132*  BUN 21 18  CREATININE 1.03 0.82  CALCIUM 9.1 9.8   PT/INR No results found for this basename: LABPROT:3,INR:3 in the last 72 hours Cholesterol  Basename  03/29/12 0605  CHOL 172   Cardiac Enzymes No components found with this basename: TROPONIN:3, CKMB:3  Studies/Results: @RISRSLT2 @   Assessment/Plan   Principal Problem:  *Chest pain Active Problems:  Diabetes mellitus type II, controlled  Malignant hypertension  Cardiomegaly  Obesity  OSA on CPAP  Plan:  Just completed lexiscan portion of Nuc.  BP elevated at 209/59.  Gave 10mg  IV hydralazine.  Neck pain during test.  Test results pending.   LOS: 2 days    HAGER, BRYAN 03/30/2012 10:38 AM  I have seen and examined the patient along with Wilburt Finlay, PA  I have reviewed the chart, notes and new data.  I agree with PA's note.  Key new complaints: No further angina. Key examination changes: no change Key new findings / data: enzymes normal; myoview pending final image acquisition  PLAN: Cath if there is a reversible defect. If normal, DC with outpatient follow-up.  Thurmon Fair, MD, [facility] [phone] 03/30/2012, 11:20 AM

## 2012-03-30 NOTE — Progress Notes (Signed)
Pt. Returned from [place] from stress test.

## 2012-03-30 NOTE — Discharge Summary (Signed)
Physician Discharge Summary  Yvonne Peterson AVW:[id number] DOB: 10-01-1945 DOA: 03/28/2012  PCP: Lieutenant Diego, MD  Admit date: 03/28/2012 Discharge date: 03/30/2012  Recommendations for Outpatient Follow-up:  1. Pt will need to follow up with PCP in 2-3 weeks post discharge 2. Please obtain BMP to evaluate electrolytes and kidney function 3. Please also check CBC to evaluate Hg and Hct levels 4. Please note that Imdur was recommended by Cardiologist and this medication was added to the pt's medical regimen 5. Imdur was called in the pharmacy directly 6. Please also note that Myoview test done while pt was in the hospital but pt was discharged prior to final results being released as pt insisted on going home 7. Myoview test results will have to be followed up and will need to determine if Cardiac Cath indicated   Discharge Diagnoses: Chest pain, likely musculoskeletal in etiology  Principal Problem:  *Chest pain Active Problems:  Diabetes mellitus type II, controlled  Malignant hypertension  Cardiomegaly  Obesity  OSA on CPAP  Discharge Condition: Stable  Diet recommendation: Heart healthy diet discussed in details   History of present illness:  Pt is 66 yo female who presented to [facility] 10/17 with sudden onset substernal chest pain and given multiple risk factors she was admitted for further evaluation and ACS rule out.  Principal Problem:  *Chest pain  - unclear etiology at this time but given risk factors Myoview was done - continued to monitor on telemetry and no acute events noted - please note initial Myoview results below - provided supportive care  - pain resolved and CE x 3 sets are within normal limits Active Problems:  Diabetes mellitus type II, controlled  - trended CBG and has remained stable during this hospital stay Malignant hypertension  - continued home medication regimen  - added Imdur  Obesity  - nutrition consultation obtained by me  OSA on CPAP  -  continue as per home regimen   Consultants:  Cardiology  Antibiotics:  None  Procedures/Studies: Nm Myocar Multi W/spect W/wall Motion / Ef 03/30/2012   IMPRESSION:  1.  Mild attenuation of the lateral wall extending towards the apex.  No definitive scintigraphic evidence of prior infarction or pharmacologically induced ischemia.  2.  Normal wall motion.  Ejection fraction - 74%.     Dg Chest Port 1 View 03/28/2012  IMPRESSION:  Stable cardiomegaly.  No active lung disease.   Discharge Exam: Filed Vitals:   03/30/12 1210  BP: 146/80  Pulse: 69  Temp: 98.5 F (36.9 C)  Resp: 20   Filed Vitals:   03/30/12 1025 03/30/12 1027 03/30/12 1029 03/30/12 1210  BP: 209/59 182/78 177/74 146/80  Pulse: 70 80 73 69  Temp:    98.5 F (36.9 C)  TempSrc:    Oral  Resp:    20  Height:      Weight:      SpO2:    100%    General: Pt is alert, follows commands appropriately, not in acute distress Cardiovascular: Regular rate and rhythm, S1/S2 +, no murmurs, no rubs, no gallops Respiratory: Clear to auscultation bilaterally, no wheezing, no crackles, no rhonchi Abdominal: Soft, non tender, non distended, bowel sounds +, no guarding Extremities: no edema, no cyanosis, pulses palpable bilaterally DP and PT Neuro: Grossly nonfocal  Discharge Instructions  Discharge Orders    Future Orders Please Complete By Expires   Diet - low sodium heart healthy      Diet - low sodium heart healthy  Diet - low sodium heart healthy      Diet - low sodium heart healthy      Diet - low sodium heart healthy      Increase activity slowly      Increase activity slowly      Increase activity slowly      Increase activity slowly      Increase activity slowly          Medication List     As of 03/30/2012  5:02 PM    TAKE these medications         bisoprolol-hydrochlorothiazide 5-6.25 MG per tablet   Commonly known as: ZIAC   Take 1 tablet by mouth daily.      cetirizine 10 MG tablet     Commonly known as: ZYRTEC   Take 10 mg by mouth daily.      HYDROcodone-acetaminophen 5-500 MG per tablet   Commonly known as: VICODIN   Take 1 tablet by mouth every 6 (six) hours as needed for pain.      isosorbide mononitrate 30 MG 24 hr tablet   Commonly known as: IMDUR   Take 1 tablet (30 mg total) by mouth daily.      losartan 100 MG tablet   Commonly known as: COZAAR   Take 100 mg by mouth daily.      metFORMIN 750 MG 24 hr tablet   Commonly known as: GLUCOPHAGE-XR   Take 750 mg by mouth 2 (two) times daily.      oxcarbazepine 600 MG tablet   Commonly known as: TRILEPTAL   Take 600 mg by mouth 2 (two) times daily.      PARoxetine 40 MG tablet   Commonly known as: PAXIL   Take 40 mg by mouth every morning.           Follow-up Information    Follow up with [facility], [place]. In 2 weeks. [employer] [facility] [place] [phone])    Contact information:   [phone] [place] [address]           The results of significant diagnostics from this hospitalization (including imaging, microbiology, ancillary and laboratory) are listed below for reference.     Microbiology: No results found for this or any previous visit (from the past 240 hour(s)).   Labs: Basic Metabolic Panel:  Lab 03/30/12 8295 03/28/12 1735  NA 130* 132*  K 4.7 3.8  CL 95* 95*  CO2 27 26  GLUCOSE 130* 132*  BUN 21 18  CREATININE 1.03 0.82  CALCIUM 9.1 9.8  MG -- --  PHOS -- --   Liver Function Tests: No results found for this basename: AST:5,ALT:5,ALKPHOS:5,BILITOT:5,PROT:5,ALBUMIN:5 in the last 168 hours No results found for this basename: LIPASE:5,AMYLASE:5 in the last 168 hours No results found for this basename: AMMONIA:5 in the last 168 hours CBC:  Lab 03/30/12 0505 03/28/12 1735  WBC 8.8 8.6  NEUTROABS -- 5.8  HGB 10.2* 11.1*  HCT 30.6* 33.6*  MCV 86.0 86.2  PLT 236 242   Cardiac Enzymes:  Lab 03/29/12 1850 03/29/12 1337 03/29/12 1200 03/29/12 0605  03/28/12 2235 03/28/12 1735  CKTOTAL -- 100 -- 103 147 --  CKMB -- 3.8 -- 3.9 4.4* --  CKMBINDEX -- -- -- -- -- --  TROPONINI <0.30 -- <0.30 -- -- <0.30   BNP: BNP (last 3 results)  Basename 03/28/12 1735  PROBNP 67.8   CBG:  Lab 03/30/12 1223 03/30/12 0726 03/29/12 2148 03/29/12 1621 03/29/12  1213  GLUCAP 103* 111* 112* 108* 106*    SIGNED: Time coordinating discharge: Over 30 minutes  Debbora Presto, MD  [facility] 03/30/2012, 5:02 PM Pager [phone]  If 7PM-7AM, please contact night-coverage [email or web address] Password [email or web address]

## 2012-05-29 ENCOUNTER — Ambulatory Visit: Payer: Medicare Other

## 2012-08-01 ENCOUNTER — Other Ambulatory Visit: Payer: Self-pay | Admitting: Rheumatology

## 2012-08-01 DIAGNOSIS — M549 Dorsalgia, unspecified: Secondary | ICD-10-CM

## 2012-08-06 ENCOUNTER — Ambulatory Visit
Admission: RE | Admit: 2012-08-06 | Discharge: 2012-08-06 | Disposition: A | Discharge status: Home / Self Care | Payer: Medicare Other | Source: Ambulatory Visit | Attending: Rheumatology | Admitting: Rheumatology

## 2012-08-06 DIAGNOSIS — M549 Dorsalgia, unspecified: Secondary | ICD-10-CM

## 2012-08-07 ENCOUNTER — Other Ambulatory Visit: Payer: Medicare Other

## 2012-12-10 HISTORY — PX: POSTERIOR LUMBAR FUSION: SHX6036

## 2013-07-16 ENCOUNTER — Encounter (INDEPENDENT_AMBULATORY_CARE_PROVIDER_SITE_OTHER): Payer: Self-pay | Admitting: General Surgery

## 2013-07-16 ENCOUNTER — Ambulatory Visit (INDEPENDENT_AMBULATORY_CARE_PROVIDER_SITE_OTHER): Payer: Medicare Other | Admitting: General Surgery

## 2013-07-16 VITALS — BP 110/66 | HR 72 | Temp 97.8°F | Resp 14 | Ht 64.0 in | Wt 230.4 lb

## 2013-07-16 DIAGNOSIS — K648 Other hemorrhoids: Secondary | ICD-10-CM | POA: Insufficient documentation

## 2013-07-16 DIAGNOSIS — K644 Residual hemorrhoidal skin tags: Secondary | ICD-10-CM

## 2013-07-16 NOTE — Progress Notes (Signed)
Patient ID: Yvonne Peterson, female   DOB: [birth year], 68 y.o.   MRN: [id number]  Chief Complaint  Patient presents with  . New Evaluation    Hems    HPI Yvonne Peterson is a 68 y.o. female.  Chief complaint: Hemorrhoids HPI Patient has a several year history of hemorrhoids. She has burning and occasional bleeding with bowel movements. She has frequent bowel movements due to IBS. Often after bowel movements, she notices prolapse of hemorrhoidal tissue. She has to reduce this with her finger. She claims her last colonoscopy was normal. Past Medical History  Diagnosis Date  . Complication of anesthesia     panic attacks  . PONV (postoperative nausea and vomiting)   . Hypertension   . Bipolar affective   . GERD (gastroesophageal reflux disease)   . Diabetes mellitus without complication     Type 2  . Arthritis   . Sleep apnea   . OSA on CPAP 03/29/2012    Past Surgical History  Procedure Laterality Date  . Cataract extraction      left eye - Nov. 2012, right eye - Mar. 2013  . Knee arthroplasty      Left knee - Dec. 2010, right kee - March 2011  . Cholecystectomy  approx. 1989  . Bladder tac  1992  . Appendectomy  June 1967  . Tonsillectomy  1955  . Nasal sinus surgery  1978  . Ovarian cyst removal  1967  . Hand surgery  approx. 2000    carpal tunnel - right hand    Family History  Problem Relation Age of Onset  . Coronary artery disease Mother   . Heart failure Mother   . Heart disease Mother   . Coronary artery disease Father   . Cancer Father     kidney  . Cancer Brother     brain    Social History History  Substance Use Topics  . Smoking status: Never Smoker   . Smokeless tobacco: Never Used  . Alcohol Use: Yes     Comment: very rarely - 1 to 2 times per year    Allergies  Allergen Reactions  . Sulfa Antibiotics Anaphylaxis and Swelling    Swelling in nasal passage    Current Outpatient Prescriptions  Medication Sig Dispense Refill  . amLODipine  (NORVASC) 5 MG tablet Take 5 mg by mouth daily.      . cetirizine (ZYRTEC) 10 MG tablet Take 10 mg by mouth daily.      . furosemide (LASIX) 20 MG tablet Take 20 mg by mouth.      . Iron-Vitamin C (IRON 100/C) 100-250 MG TABS Take by mouth.      . labetalol (NORMODYNE) 200 MG tablet Take 200 mg by mouth 2 (two) times daily.      Marland Kitchen losartan (COZAAR) 100 MG tablet Take 100 mg by mouth daily.      . metFORMIN (GLUCOPHAGE-XR) 750 MG 24 hr tablet Take 750 mg by mouth 2 (two) times daily.      . Omega-3 Fatty Acids (SYSTANE OMEGA-3 HEALTHY TEARS PO) Take by mouth.      Marland Kitchen oxcarbazepine (TRILEPTAL) 600 MG tablet Take 600 mg by mouth 2 (two) times daily.      . Oxymetazoline HCl-Menthol 0.05 % SOLN Place into the nose.      Marland Kitchen PARoxetine (PAXIL) 40 MG tablet Take 40 mg by mouth every morning.       No current facility-administered medications for this visit.  Review of Systems Review of Systems  Constitutional: Negative for fever, chills and unexpected weight change.  HENT: Negative for congestion, hearing loss, sore throat, trouble swallowing and voice change.   Eyes: Negative for visual disturbance.  Respiratory: Negative for cough and wheezing.   Cardiovascular: Negative for chest pain, palpitations and leg swelling.  Gastrointestinal: Positive for diarrhea, anal bleeding and rectal pain. Negative for nausea, vomiting, abdominal pain, constipation, blood in stool and abdominal distention.  Genitourinary: Negative for hematuria, vaginal bleeding and difficulty urinating.  Musculoskeletal: Negative for arthralgias.  Skin: Negative for rash and wound.  Neurological: Negative for seizures, syncope and headaches.  Hematological: Negative for adenopathy. Does not bruise/bleed easily.  Psychiatric/Behavioral: Negative for confusion.       Depression    Blood pressure 110/66, pulse 72, temperature 97.8 F (36.6 C), temperature source Temporal, resp. rate 14, height 5\' 4"  (1.626 m), weight 230 lb  6.4 oz (104.509 kg).  Physical Exam Physical Exam  Constitutional: She is oriented to person, place, and time. She appears well-developed and well-nourished. No distress.  HENT:  Head: Normocephalic and atraumatic.  Mouth/Throat: Oropharynx is clear and moist. No oropharyngeal exudate.  Eyes: EOM are normal. Pupils are equal, round, and reactive to light.  Neck: Normal range of motion. No tracheal deviation present.  Cardiovascular: Normal rate, normal heart sounds and intact distal pulses.   Pulmonary/Chest: Effort normal and breath sounds normal. No stridor.  Abdominal: Soft. She exhibits no distension. There is no tenderness. There is no rebound and no guarding.  Genitourinary:  External anal exam reveals 2 small anterior skin tags, no external hemorrhoids, no perianal infections, digital rectal exam reveals large internal hemorrhoid posteriorly.   Procedure: Anoscopy was then done demonstrating this large internal hemorrhoid.  Rubber band applicator was used to apply 2 rubber bands to this large hemorrhoid. She tolerated this without pain. There was minimal bleeding.  Musculoskeletal: Normal range of motion. She exhibits no tenderness.  Neurological: She is alert and oriented to person, place, and time.  Skin: Skin is warm.    Data Reviewed Office notes from Dr. Thea SilversmithMacKenzie  Assessment    Symptomatic prolapsing internal hemorrhoid, anal skin tags    Plan    Pad applications as above. I will see her back in 6 weeks. We may need to do further treatments in the office.       Edwen Mclester E 07/16/2013, 12:30 PM

## 2013-08-10 HISTORY — PX: HEMORRHOID BANDING: SHX5850

## 2013-08-27 ENCOUNTER — Encounter (INDEPENDENT_AMBULATORY_CARE_PROVIDER_SITE_OTHER): Payer: Medicare Other | Admitting: General Surgery

## 2013-09-06 DIAGNOSIS — R2681 Unsteadiness on feet: Secondary | ICD-10-CM

## 2013-09-06 HISTORY — DX: Unsteadiness on feet: R26.81

## 2013-09-10 ENCOUNTER — Ambulatory Visit (INDEPENDENT_AMBULATORY_CARE_PROVIDER_SITE_OTHER): Payer: Medicare Other | Admitting: General Surgery

## 2013-09-10 ENCOUNTER — Emergency Department (HOSPITAL_COMMUNITY): Payer: Medicare Other

## 2013-09-10 ENCOUNTER — Inpatient Hospital Stay (HOSPITAL_COMMUNITY)
Admission: EM | Admit: 2013-09-10 | Discharge: 2013-09-12 | DRG: 641 | Disposition: A | Discharge status: Home / Self Care | Payer: Medicare Other | Attending: Internal Medicine | Admitting: Internal Medicine

## 2013-09-10 ENCOUNTER — Encounter (HOSPITAL_COMMUNITY): Payer: Self-pay | Admitting: Emergency Medicine

## 2013-09-10 ENCOUNTER — Encounter (INDEPENDENT_AMBULATORY_CARE_PROVIDER_SITE_OTHER): Payer: Self-pay | Admitting: General Surgery

## 2013-09-10 VITALS — BP 127/88 | HR 77 | Temp 98.1°F | Resp 18 | Ht 64.5 in | Wt 229.2 lb

## 2013-09-10 DIAGNOSIS — E871 Hypo-osmolality and hyponatremia: Principal | ICD-10-CM | POA: Diagnosis present

## 2013-09-10 DIAGNOSIS — Z882 Allergy status to sulfonamides status: Secondary | ICD-10-CM

## 2013-09-10 DIAGNOSIS — I517 Cardiomegaly: Secondary | ICD-10-CM

## 2013-09-10 DIAGNOSIS — E119 Type 2 diabetes mellitus without complications: Secondary | ICD-10-CM | POA: Diagnosis present

## 2013-09-10 DIAGNOSIS — Z8249 Family history of ischemic heart disease and other diseases of the circulatory system: Secondary | ICD-10-CM

## 2013-09-10 DIAGNOSIS — E669 Obesity, unspecified: Secondary | ICD-10-CM

## 2013-09-10 DIAGNOSIS — R079 Chest pain, unspecified: Secondary | ICD-10-CM

## 2013-09-10 DIAGNOSIS — Z9989 Dependence on other enabling machines and devices: Secondary | ICD-10-CM

## 2013-09-10 DIAGNOSIS — T4275XA Adverse effect of unspecified antiepileptic and sedative-hypnotic drugs, initial encounter: Secondary | ICD-10-CM | POA: Diagnosis present

## 2013-09-10 DIAGNOSIS — T502X5A Adverse effect of carbonic-anhydrase inhibitors, benzothiadiazides and other diuretics, initial encounter: Secondary | ICD-10-CM | POA: Diagnosis present

## 2013-09-10 DIAGNOSIS — M129 Arthropathy, unspecified: Secondary | ICD-10-CM | POA: Diagnosis present

## 2013-09-10 DIAGNOSIS — F319 Bipolar disorder, unspecified: Secondary | ICD-10-CM

## 2013-09-10 DIAGNOSIS — I10 Essential (primary) hypertension: Secondary | ICD-10-CM

## 2013-09-10 DIAGNOSIS — G4733 Obstructive sleep apnea (adult) (pediatric): Secondary | ICD-10-CM | POA: Diagnosis present

## 2013-09-10 DIAGNOSIS — Z96659 Presence of unspecified artificial knee joint: Secondary | ICD-10-CM

## 2013-09-10 DIAGNOSIS — R269 Unspecified abnormalities of gait and mobility: Secondary | ICD-10-CM

## 2013-09-10 DIAGNOSIS — K648 Other hemorrhoids: Secondary | ICD-10-CM

## 2013-09-10 DIAGNOSIS — R2681 Unsteadiness on feet: Secondary | ICD-10-CM

## 2013-09-10 DIAGNOSIS — T43205A Adverse effect of unspecified antidepressants, initial encounter: Secondary | ICD-10-CM | POA: Diagnosis present

## 2013-09-10 HISTORY — DX: Depression, unspecified: F32.A

## 2013-09-10 HISTORY — DX: Type 2 diabetes mellitus without complications: E11.9

## 2013-09-10 HISTORY — DX: Major depressive disorder, single episode, unspecified: F32.9

## 2013-09-10 HISTORY — DX: Anemia, unspecified: D64.9

## 2013-09-10 HISTORY — DX: Unsteadiness on feet: R26.81

## 2013-09-10 HISTORY — DX: Migraine, unspecified, not intractable, without status migrainosus: G43.909

## 2013-09-10 HISTORY — DX: Anxiety disorder, unspecified: F41.9

## 2013-09-10 HISTORY — DX: Personal history of other medical treatment: Z92.89

## 2013-09-10 LAB — COMPREHENSIVE METABOLIC PANEL
ALBUMIN: 3.7 g/dL (ref 3.5–5.2)
ALT: 53 U/L — ABNORMAL HIGH (ref 0–35)
AST: 121 U/L — AB (ref 0–37)
Alkaline Phosphatase: 63 U/L (ref 39–117)
BILIRUBIN TOTAL: 0.3 mg/dL (ref 0.3–1.2)
BUN: 11 mg/dL (ref 6–23)
CALCIUM: 9.1 mg/dL (ref 8.4–10.5)
CHLORIDE: 78 meq/L — AB (ref 96–112)
CO2: 24 mEq/L (ref 19–32)
CREATININE: 0.8 mg/dL (ref 0.50–1.10)
GFR calc Af Amer: 85 mL/min — ABNORMAL LOW (ref >90)
GFR calc non Af Amer: 73 mL/min — ABNORMAL LOW (ref >90)
Glucose, Bld: 96 mg/dL (ref 70–99)
Potassium: 4.7 mEq/L (ref 3.7–5.3)
Sodium: 116 mEq/L — CL (ref 137–147)
TOTAL PROTEIN: 7.2 g/dL (ref 6.0–8.3)

## 2013-09-10 LAB — BASIC METABOLIC PANEL
BUN: 14 mg/dL (ref 6–23)
CALCIUM: 8.9 mg/dL (ref 8.4–10.5)
CO2: 25 meq/L (ref 19–32)
Chloride: 78 mEq/L — ABNORMAL LOW (ref 96–112)
Creatinine, Ser: 0.77 mg/dL (ref 0.50–1.10)
GFR calc Af Amer: >90 mL/min (ref >90)
GFR calc non Af Amer: 83 mL/min — ABNORMAL LOW (ref >90)
GLUCOSE: 117 mg/dL — AB (ref 70–99)
Potassium: 4.1 mEq/L (ref 3.7–5.3)
Sodium: 118 mEq/L — CL (ref 137–147)

## 2013-09-10 LAB — CBC WITH DIFFERENTIAL/PLATELET
Basophils Absolute: 0 10*3/uL (ref 0.0–0.1)
Basophils Relative: 0 % (ref 0–1)
Eosinophils Absolute: 0.3 10*3/uL (ref 0.0–0.7)
Eosinophils Relative: 3 % (ref 0–5)
HEMATOCRIT: 33.4 % — AB (ref 36.0–46.0)
HEMOGLOBIN: 12 g/dL (ref 12.0–15.0)
LYMPHS PCT: 16 % (ref 12–46)
Lymphs Abs: 1.3 10*3/uL (ref 0.7–4.0)
MCH: 29.1 pg (ref 26.0–34.0)
MCHC: 35.9 g/dL (ref 30.0–36.0)
MCV: 80.9 fL (ref 78.0–100.0)
MONO ABS: 0.6 10*3/uL (ref 0.1–1.0)
MONOS PCT: 7 % (ref 3–12)
NEUTROS ABS: 6.2 10*3/uL (ref 1.7–7.7)
NEUTROS PCT: 74 % (ref 43–77)
Platelets: 204 10*3/uL (ref 150–400)
RBC: 4.13 MIL/uL (ref 3.87–5.11)
RDW: 13.7 % (ref 11.5–15.5)
WBC: 8.4 10*3/uL (ref 4.0–10.5)

## 2013-09-10 LAB — TROPONIN I

## 2013-09-10 LAB — CBG MONITORING, ED: Glucose-Capillary: 101 mg/dL — ABNORMAL HIGH (ref 70–99)

## 2013-09-10 LAB — GLUCOSE, CAPILLARY: Glucose-Capillary: 141 mg/dL — ABNORMAL HIGH (ref 70–99)

## 2013-09-10 LAB — LIPASE, BLOOD: Lipase: 138 U/L — ABNORMAL HIGH (ref 11–59)

## 2013-09-10 LAB — TSH: TSH: 2.85 u[IU]/mL (ref 0.350–4.500)

## 2013-09-10 MED ORDER — AMLODIPINE BESYLATE 5 MG PO TABS
5.0000 mg | ORAL_TABLET | Freq: Every day | ORAL | Status: DC
  Administered 2013-09-11 – 2013-09-12 (×2): 5 mg via ORAL
  Filled 2013-09-10 (×2): qty 1

## 2013-09-10 MED ORDER — ACETAMINOPHEN 650 MG RE SUPP: 650.0000 mg | Freq: Four times a day (QID) | RECTAL | Status: DC | PRN

## 2013-09-10 MED ORDER — LORATADINE 10 MG PO TABS
10.0000 mg | ORAL_TABLET | Freq: Every day | ORAL | Status: DC
  Administered 2013-09-11 – 2013-09-12 (×2): 10 mg via ORAL
  Filled 2013-09-10 (×2): qty 1

## 2013-09-10 MED ORDER — SODIUM CHLORIDE 0.9 % IJ SOLN
3.0000 mL | Freq: Two times a day (BID) | INTRAMUSCULAR | Status: DC
  Administered 2013-09-11 – 2013-09-12 (×2): 3 mL via INTRAVENOUS

## 2013-09-10 MED ORDER — LEVETIRACETAM 500 MG PO TABS
500.0000 mg | ORAL_TABLET | Freq: Two times a day (BID) | ORAL | Status: DC
  Administered 2013-09-10 – 2013-09-12 (×4): 500 mg via ORAL
  Filled 2013-09-10 (×5): qty 1

## 2013-09-10 MED ORDER — SODIUM CHLORIDE 0.9 % IV BOLUS (SEPSIS)
500.0000 mL | Freq: Once | INTRAVENOUS | Status: AC
  Administered 2013-09-10: 500 mL via INTRAVENOUS

## 2013-09-10 MED ORDER — ACETAMINOPHEN 325 MG PO TABS: 650.0000 mg | ORAL_TABLET | Freq: Four times a day (QID) | ORAL | Status: DC | PRN

## 2013-09-10 MED ORDER — INSULIN ASPART 100 UNIT/ML ~~LOC~~ SOLN
0.0000 [IU] | Freq: Three times a day (TID) | SUBCUTANEOUS | Status: DC
  Administered 2013-09-11: 1 [IU] via SUBCUTANEOUS
  Administered 2013-09-11: 2 [IU] via SUBCUTANEOUS
  Administered 2013-09-11: 1 [IU] via SUBCUTANEOUS
  Administered 2013-09-12: 3 [IU] via SUBCUTANEOUS

## 2013-09-10 MED ORDER — ONDANSETRON HCL 4 MG PO TABS
4.0000 mg | ORAL_TABLET | Freq: Four times a day (QID) | ORAL | Status: DC | PRN
Start: 2013-09-10 — End: 2013-09-12

## 2013-09-10 MED ORDER — SODIUM CHLORIDE 0.9 % IV SOLN
INTRAVENOUS | Status: DC
  Administered 2013-09-10: 1000 mL via INTRAVENOUS

## 2013-09-10 MED ORDER — ENOXAPARIN SODIUM 40 MG/0.4ML ~~LOC~~ SOLN
40.0000 mg | SUBCUTANEOUS | Status: DC
  Filled 2013-09-10 (×4): qty 0.4

## 2013-09-10 MED ORDER — ONDANSETRON HCL 4 MG/2ML IJ SOLN: 4.0000 mg | Freq: Four times a day (QID) | INTRAMUSCULAR | Status: DC | PRN

## 2013-09-10 MED ORDER — HYDRALAZINE HCL 20 MG/ML IJ SOLN
5.0000 mg | Freq: Four times a day (QID) | INTRAMUSCULAR | Status: DC | PRN
  Filled 2013-09-10: qty 1

## 2013-09-10 MED ORDER — LOSARTAN POTASSIUM 50 MG PO TABS
100.0000 mg | ORAL_TABLET | Freq: Every day | ORAL | Status: DC
  Administered 2013-09-11 – 2013-09-12 (×2): 100 mg via ORAL
  Filled 2013-09-10 (×2): qty 2

## 2013-09-10 MED ORDER — SODIUM CHLORIDE 0.9 % IV SOLN
INTRAVENOUS | Status: DC
  Administered 2013-09-10: 14:00:00 via INTRAVENOUS

## 2013-09-10 MED ORDER — LABETALOL HCL 200 MG PO TABS
200.0000 mg | ORAL_TABLET | Freq: Two times a day (BID) | ORAL | Status: DC
  Administered 2013-09-10 – 2013-09-12 (×4): 200 mg via ORAL
  Filled 2013-09-10 (×5): qty 1

## 2013-09-10 NOTE — ED Provider Notes (Signed)
CSN: [id number]     Arrival date & time 09/10/13  1243 History   First MD Initiated Contact with Patient 09/10/13 1314     Chief Complaint  Patient presents with  . Difficulty Walking     (Consider location/radiation/quality/duration/timing/severity/associated sxs/prior Treatment) The history is provided by the patient.  -year-old female with 2 day history of a gait disturbance and generalized weakness. Patient was at her surgeon's office today for reevaluation of hemorrhoids and they noted that she seemed to be out of it a little better and she was referred here. Patient lives at home with family members. Patient denies syncope any kind of focal weakness. When she tries to walk it's not like leaning to one side the gait is often bouncing back and forth. Patient never had anything like this before no speech disturbances. However has had a headache on and off starting about a week to 2 weeks ago admits to currently mild. No history of any severe headache. No nausea no vomiting no chest pain no shortness of breath.  Past Medical History  Diagnosis Date  . Complication of anesthesia     panic attacks  . PONV (postoperative nausea and vomiting)   . Hypertension   . Bipolar affective   . GERD (gastroesophageal reflux disease)   . Diabetes mellitus without complication     Type 2  . Arthritis   . Sleep apnea   . OSA on CPAP 03/29/2012   Past Surgical History  Procedure Laterality Date  . Cataract extraction      left eye - Nov. 2012, right eye - Mar. 2013  . Knee arthroplasty      Left knee - Dec. 2010, right kee - March 2011  . Cholecystectomy  approx. 1989  . Bladder tac  1992  . Appendectomy  June 1967  . Tonsillectomy  1955  . Nasal sinus surgery  1978  . Ovarian cyst removal  1967  . Hand surgery  approx. 2000    carpal tunnel - right hand   Family History  Problem Relation Age of Onset  . Coronary artery disease Mother   . Heart failure Mother   . Heart disease Mother    . Coronary artery disease Father   . Cancer Father     kidney  . Cancer Brother     brain   History  Substance Use Topics  . Smoking status: Never Smoker   . Smokeless tobacco: Never Used  . Alcohol Use: Yes     Comment: very rarely - 1 to 2 times per year   OB History   Grav Para Term Preterm Abortions TAB SAB Ect Mult Living                 Review of Systems  Constitutional: Negative for fever.  HENT: Negative for congestion.   Eyes: Negative for visual disturbance.  Respiratory: Negative for shortness of breath.   Cardiovascular: Negative for chest pain.  Genitourinary: Negative for dysuria.  Musculoskeletal: Negative for back pain.  Skin: Negative for rash.  Neurological: Positive for weakness and headaches. Negative for syncope, speech difficulty and numbness.  Hematological: Does not bruise/bleed easily.  Psychiatric/Behavioral: Negative for confusion.      Allergies  Sulfa antibiotics  Home Medications   Current Outpatient Rx  Name  Route  Sig  Dispense  Refill  . amLODipine (NORVASC) 5 MG tablet   Oral   Take 5 mg by mouth daily.         Marland Kitchen  cetirizine (ZYRTEC) 10 MG tablet   Oral   Take 10 mg by mouth daily.         . furosemide (LASIX) 20 MG tablet   Oral   Take 20 mg by mouth.         . labetalol (NORMODYNE) 200 MG tablet   Oral   Take 200 mg by mouth 2 (two) times daily.         Marland Kitchen. losartan (COZAAR) 100 MG tablet   Oral   Take 100 mg by mouth daily.         . metFORMIN (GLUCOPHAGE-XR) 750 MG 24 hr tablet   Oral   Take 750 mg by mouth 2 (two) times daily.         Marland Kitchen. oxcarbazepine (TRILEPTAL) 600 MG tablet   Oral   Take 600 mg by mouth 2 (two) times daily.         Marland Kitchen. PARoxetine (PAXIL) 40 MG tablet   Oral   Take 40 mg by mouth every morning.          BP 163/82  Pulse 62  Resp 16  SpO2 99% Physical Exam  Nursing note and vitals reviewed. Constitutional: She is oriented to person, place, and time. She appears  well-developed and well-nourished. No distress.  HENT:  Head: Normocephalic and atraumatic.  Mouth/Throat: Oropharynx is clear and moist.  Eyes: Conjunctivae and EOM are normal. Pupils are equal, round, and reactive to light.  Neck: Normal range of motion. Neck supple.  Cardiovascular: Normal rate, regular rhythm and normal heart sounds.   No murmur heard. Pulmonary/Chest: Effort normal and breath sounds normal. No respiratory distress.  Abdominal: Soft. Bowel sounds are normal. There is no tenderness.  Musculoskeletal: Normal range of motion. She exhibits no edema.  Neurological: She is alert and oriented to person, place, and time. No cranial nerve deficit. She exhibits normal muscle tone. Coordination normal.  Skin: Skin is warm. No rash noted.    ED Course  Procedures (including critical care time) Labs Review Labs Reviewed  COMPREHENSIVE METABOLIC PANEL - Abnormal; Notable for the following:    Sodium 116 (*)    Chloride 78 (*)    AST 121 (*)    ALT 53 (*)    GFR calc non Af Amer 73 (*)    GFR calc Af Amer 85 (*)    All other components within normal limits  LIPASE, BLOOD - Abnormal; Notable for the following:    Lipase 138 (*)    All other components within normal limits  CBC WITH DIFFERENTIAL - Abnormal; Notable for the following:    HCT 33.4 (*)    All other components within normal limits  CBG MONITORING, ED - Abnormal; Notable for the following:    Glucose-Capillary 101 (*)    All other components within normal limits  TROPONIN I  URINALYSIS, ROUTINE W REFLEX MICROSCOPIC   Results for orders placed during the hospital encounter of 09/10/13  TROPONIN I      Result Value Ref Range   Troponin I <0.30  <0.30 ng/mL  COMPREHENSIVE METABOLIC PANEL      Result Value Ref Range   Sodium 116 (*) 137 - 147 mEq/L   Potassium 4.7  3.7 - 5.3 mEq/L   Chloride 78 (*) 96 - 112 mEq/L   CO2 24  19 - 32 mEq/L   Glucose, Bld 96  70 - 99 mg/dL   BUN 11  6 - 23 mg/dL    Creatinine,  Ser 0.80  0.50 - 1.10 mg/dL   Calcium 9.1  8.4 - 16.1 mg/dL   Total Protein 7.2  6.0 - 8.3 g/dL   Albumin 3.7  3.5 - 5.2 g/dL   AST 096 (*) 0 - 37 U/L   ALT 53 (*) 0 - 35 U/L   Alkaline Phosphatase 63  39 - 117 U/L   Total Bilirubin 0.3  0.3 - 1.2 mg/dL   GFR calc non Af Amer 73 (*) >90 mL/min   GFR calc Af Amer 85 (*) >90 mL/min  LIPASE, BLOOD      Result Value Ref Range   Lipase 138 (*) 11 - 59 U/L  CBC WITH DIFFERENTIAL      Result Value Ref Range   WBC 8.4  4.0 - 10.5 K/uL   RBC 4.13  3.87 - 5.11 MIL/uL   Hemoglobin 12.0  12.0 - 15.0 g/dL   HCT 04.5 (*) 40.9 - 81.1 %   MCV 80.9  78.0 - 100.0 fL   MCH 29.1  26.0 - 34.0 pg   MCHC 35.9  30.0 - 36.0 g/dL   RDW 91.4  78.2 - 95.6 %   Platelets 204  150 - 400 K/uL   Neutrophils Relative % 74  43 - 77 %   Neutro Abs 6.2  1.7 - 7.7 K/uL   Lymphocytes Relative 16  12 - 46 %   Lymphs Abs 1.3  0.7 - 4.0 K/uL   Monocytes Relative 7  3 - 12 %   Monocytes Absolute 0.6  0.1 - 1.0 K/uL   Eosinophils Relative 3  0 - 5 %   Eosinophils Absolute 0.3  0.0 - 0.7 K/uL   Basophils Relative 0  0 - 1 %   Basophils Absolute 0.0  0.0 - 0.1 K/uL  CBG MONITORING, ED      Result Value Ref Range   Glucose-Capillary 101 (*) 70 - 99 mg/dL    Imaging Review Dg Chest 2 View  09/10/2013   CLINICAL DATA:  Weakness  EXAM: CHEST  2 VIEW  COMPARISON:  DG CHEST 1V PORT dated 03/28/2012; DG CHEST 1V PORT dated 05/19/2010  FINDINGS: The lungs are clear without focal infiltrate, edema, pneumothorax or pleural effusion. Interstitial markings are diffusely coarsened with chronic features. The cardiopericardial silhouette is within normal limits for size. Imaged bony structures of the thorax are intact.  IMPRESSION: No active cardiopulmonary disease.   Electronically Signed   By: Kennith Center M.D.   On: 09/10/2013 14:41   Ct Head Wo Contrast  09/10/2013   CLINICAL DATA:  The walking  EXAM: CT HEAD WITHOUT CONTRAST  TECHNIQUE: Contiguous axial images were  obtained from the base of the skull through the vertex without intravenous contrast.  COMPARISON:  Prior CT from 09/14/2008  FINDINGS: Mild atrophy with chronic microvascular ischemic changes again noted, stable.  There is no acute intracranial hemorrhage or infarct. No mass lesion or midline shift. Gray-white matter differentiation is well maintained. Ventricles are normal in size without evidence of hydrocephalus. CSF containing spaces are within normal limits. No extra-axial fluid collection.  The calvarium is intact.  Orbital soft tissues are within normal limits. Bilateral lens implants noted.  Minimal opacity noted within the sphenoid sinuses bilaterally. Paranasal sinuses are otherwise clear. No mastoid effusion.  Scalp soft tissues are unremarkable.  IMPRESSION: 1. No acute intracranial process. 2. Mild atrophy with chronic microvascular ischemic disease, similar to previous.   Electronically Signed   By: Rise Mu  M.D.   On: 09/10/2013 15:27     EKG Interpretation None      MDM   Final diagnoses:  Hyponatremia  Abnormal gait    Patient presented for abnormal gait and feeling generalized weakness. Head CT negative for evidence of a stroke. Symptoms been ongoing for 2 days. However electrolytes showed marked hyponatremia patient is on Lasix 100% sure why. Patient is also on some bipolar medications. However this hyponatremia significant perhaps explain her neurologic symptoms. On neuro exam no gross neurological deficits no significant neuro findings. However did not ambulate the patient. We'll discuss with hospitalist for admission. Patient is followed by Dr. Ferd Hibbs from [facility].    Shelda Jakes, MD 09/10/13 1556

## 2013-09-10 NOTE — ED Notes (Addendum)
Pt reports to the ED for eval of difficulty walking and problems with her coordination. LSN was Sunday and reports that when she awoke on Monday she was having difficulty walking and performing tasks. No neuro deficits noted. Pt was sitting in the waiting room of her surgeon's office for revaluation of her hemorrhoid removal and they noted she was not her normal self and called 911. She also reports generalized weakness. Denies any dizziness, lightheadedness, N/V, or pain. Pt describes the sensation of "being drunk." She reports she will try to walk in a straight line and cannot and that she cannot dress or perform tasks of daily living. She is A&Ox4, resp e/u, and skin warm and dry.

## 2013-09-10 NOTE — Progress Notes (Addendum)
Subjective:     Patient ID: Yvonne Peterson, female   DOB: December 18, 1945, 68 y.o.   MRN: [id number]  HPI Patient had prolapsing internal hemorrhoid with bleeding. She underwent rubber band application. She has done well since that time. She had some spotting but this has resolved and she feels much better.  Review of Systems     Objective:   Physical Exam  Cardiovascular: Normal rate and normal heart sounds.   Pulmonary/Chest: Effort normal and breath sounds normal. No respiratory distress. She has no wheezes.  Abdominal: Soft. She exhibits no distension. There is no tenderness.  External anal exam reveals anal skin tags which are stable, no external hemorrhoids, digital rectal exam reveals significant improvement in internal hemorrhoids, no bleeding     Assessment:     Internal hemorrhoids much improved after band ligation    Plan:     I recommended fiber supplementation such as Metamucil. We will be happy to see her back should any issues with hemorrhoids return.     As she was about to leave, the patient complained she felt she may have had a stroke. She seems to have problems with coordination. She claims this has been for 72 hours. It was preceded by a bad headache. Her gait is unsteady and she seems somewhat weak on the left side. She is hypertensive with blood pressure 168/110.She was reluctant but we have called the ambulance and she'll be taken to the emergency room for prompt evaluation. I called Dr. Dimas MillinMcKenzie's office and let them know as well as canceled her appointment for this afternoon.

## 2013-09-10 NOTE — H&P (Signed)
[facility] History and Physical  Yvonne Peterson OZH:[id number] DOB: 30-Aug-1945 DOA: 09/10/2013   PCP: Thayer Headings, MD   Chief Complaint: Generalized weakness, gait instability  HPI:  68 year old female with history of bipolar disorder, diabetes mellitus type 2, hypertension, and sleep apnea presents with four-day history of generalized weakness and gait instability. The patient states that this began approximately 09/06/2013 and worsened on a daily basis. She has not suffered any falls or syncope. She did admit to some intermittent nausea but has not had any vomiting or diarrhea. She denies any abdominal pain. She has not had any fevers, chills, chest discomfort,, abdominal pain, dysuria, hematuria, rashes, focal extremity weakness, visual disturbance. She denies any headaches. The patient went to see her general surgeon today and was noted to have significant gait instability. As a result, EMS was activated. The patient was brought to the emergency department. The patient was noted to have a serum sodium of 116. The remainder of her electrolytes were unremarkable. CBC was negative. The patient was hemodynamically stable. AST 121, ALT 53, lipase 138. Initial troponin was negative. Chest x-ray was negative. CT of the brain was negative for acute changes. Assessment/Plan: Hyponatremia -Likely due to a combination of Trileptal, Paxil, and furosemide -After long discussion with the patient, it remains unclear exactly why the patient is on Trileptal--she denies any previous history of seizure disorder and has never seen a neurologist  (on trileptal >9yr) -Given the unclear reason for her Trileptal and concern for its use in seizure, what appeared to treat the patient with Keppra pending clarification -Discontinue Paxil and furosemide -continue the patient on 0.9NS for now as I expect improvement in the patient's sodium with the discontinuation of Trileptal and Paxil and to avoid rapid  overcorrection after d/c of Trileptal and Paxil -Hold off on hypertonic saline at this time unless no improvement -Na deficit is approx 1120 mEq -BMP every 2 hours -Check TSH, urine osmolarity, serum osmolarity, urine sodium, urine creatinine Elevated lipase -Clinically not presenting with pancreatitis--no abdominal pain or vomiting -Abdominal ultrasound Hypertension -Continue amlodipine, losartan, labetalol Diabetes mellitus type 2 -Discontinue metformin -Hemoglobin A1c -NovoLog sliding scale Bipolar disorder -May need to add med to substitute for paxil Gait instability -Likely due to hyponatremia -MRI brain if no improvement with correction of Na       Past Medical History  Diagnosis Date  . Complication of anesthesia     panic attacks  . PONV (postoperative nausea and vomiting)   . Hypertension   . Bipolar affective   . GERD (gastroesophageal reflux disease)   . Diabetes mellitus without complication     Type 2  . Arthritis   . Sleep apnea   . OSA on CPAP 03/29/2012   Past Surgical History  Procedure Laterality Date  . Cataract extraction      left eye - Nov. 2012, right eye - Mar. 2013  . Knee arthroplasty      Left knee - Dec. 2010, right kee - March 2011  . Cholecystectomy  approx. 1989  . Bladder tac  1992  . Appendectomy  June 1967  . Tonsillectomy  1955  . Nasal sinus surgery  1978  . Ovarian cyst removal  1967  . Hand surgery  approx. 2000    carpal tunnel - right hand   Social History:  reports that she has never smoked. She has never used smokeless tobacco. She reports that she drinks alcohol. She reports that she does not use illicit drugs.  Family History  Problem Relation Age of Onset  . Coronary artery disease Mother   . Heart failure Mother   . Heart disease Mother   . Coronary artery disease Father   . Cancer Father     kidney  . Cancer Brother     brain     Allergies  Allergen Reactions  . Sulfa Antibiotics Anaphylaxis and  Swelling    Swelling in nasal passage      Prior to Admission medications   Medication Sig Start Date End Date Taking? Authorizing Provider  amLODipine (NORVASC) 5 MG tablet Take 5 mg by mouth daily.   Yes Historical Provider, MD  cetirizine (ZYRTEC) 10 MG tablet Take 10 mg by mouth daily.   Yes Historical Provider, MD  furosemide (LASIX) 20 MG tablet Take 20 mg by mouth.   Yes Historical Provider, MD  labetalol (NORMODYNE) 200 MG tablet Take 200 mg by mouth 2 (two) times daily.   Yes Historical Provider, MD  losartan (COZAAR) 100 MG tablet Take 100 mg by mouth daily.   Yes Historical Provider, MD  metFORMIN (GLUCOPHAGE-XR) 750 MG 24 hr tablet Take 750 mg by mouth 2 (two) times daily.   Yes Historical Provider, MD  oxcarbazepine (TRILEPTAL) 600 MG tablet Take 600 mg by mouth 2 (two) times daily.   Yes Historical Provider, MD  PARoxetine (PAXIL) 40 MG tablet Take 40 mg by mouth every morning.   Yes Historical Provider, MD    Review of Systems:  Constitutional:  No weight loss, night sweats, Fevers, chill Head&Eyes: No headache.  No vision loss.  No eye pain or scotoma ENT:  No Difficulty swallowing,Tooth/dental problems,Sore throat,  No ear ache, post nasal drip,  Cardio-vascular:  No chest pain, Orthopnea, PND, swelling in lower extremities,  dizziness, palpitations  GI:  No  abdominal pain, nausea, vomiting, diarrhea, loss of appetite, hematochezia, melena, heartburn, indigestion, Resp:  No cough. No coughing up of blood .No wheezing.No chest wall deformity  Skin:  no rash or lesions.  GU:  no dysuria, change in color of urine, no urgency or frequency. No flank pain.  Musculoskeletal:  No joint pain or swelling. No decreased range of motion. No back pain.  Psych:  No change in mood or affect. No depression or anxiety. Neurologic: No headache, no dysesthesia, no focal weakness, no vision loss. No syncope  Physical Exam: Filed Vitals:   09/10/13 1415 09/10/13 1530 09/10/13  1545 09/10/13 1600  BP: 163/82 172/88 184/95 172/86  Pulse: 62 71 70 73  Resp: 16 12 14 18   SpO2: 99% 99% 97% 98%   General:  A&O x 3, NAD, nontoxic, pleasant/cooperative Head/Eye: No conjunctival hemorrhage, no icterus, [place]/AT, No nystagmus ENT:  No icterus,  No thrush, good dentition, no pharyngeal exudate Neck:  No masses, no lymphadenpathy, no bruits CV:  RRR, no rub, no gallop, no S3 Lung:  CTAB, good air movement, no wheeze, no rhonchi Abdomen: soft/NT, +BS, nondistended, no peritoneal signs Ext: No cyanosis, No rashes, No petechiae, No lymphangitis, No edema Neuro: CNII-XII intact, strength 4/5 in bilateral upper and lower extremities, no dysmetria  Labs on Admission:  Basic Metabolic Panel:  Recent Labs Lab 09/10/13 1340  NA 116*  K 4.7  CL 78*  CO2 24  GLUCOSE 96  BUN 11  CREATININE 0.80  CALCIUM 9.1   Liver Function Tests:  Recent Labs Lab 09/10/13 1340  AST 121*  ALT 53*  ALKPHOS 63  BILITOT 0.3  PROT 7.2  ALBUMIN  3.7    Recent Labs Lab 09/10/13 1340  LIPASE 138*   No results found for this basename: AMMONIA,  in the last 168 hours CBC:  Recent Labs Lab 09/10/13 1340  WBC 8.4  NEUTROABS 6.2  HGB 12.0  HCT 33.4*  MCV 80.9  PLT 204   Cardiac Enzymes:  Recent Labs Lab 09/10/13 1340  TROPONINI <0.30   BNP: No components found with this basename: POCBNP,  CBG:  Recent Labs Lab 09/10/13 1348  GLUCAP 101*    Radiological Exams on Admission: Dg Chest 2 View  09/10/2013   CLINICAL DATA:  Weakness  EXAM: CHEST  2 VIEW  COMPARISON:  DG CHEST 1V PORT dated 03/28/2012; DG CHEST 1V PORT dated 05/19/2010  FINDINGS: The lungs are clear without focal infiltrate, edema, pneumothorax or pleural effusion. Interstitial markings are diffusely coarsened with chronic features. The cardiopericardial silhouette is within normal limits for size. Imaged bony structures of the thorax are intact.  IMPRESSION: No active cardiopulmonary disease.    Electronically Signed   By: Kennith CenterEric  Mansell M.D.   On: 09/10/2013 14:41   Ct Head Wo Contrast  09/10/2013   CLINICAL DATA:  The walking  EXAM: CT HEAD WITHOUT CONTRAST  TECHNIQUE: Contiguous axial images were obtained from the base of the skull through the vertex without intravenous contrast.  COMPARISON:  Prior CT from 09/14/2008  FINDINGS: Mild atrophy with chronic microvascular ischemic changes again noted, stable.  There is no acute intracranial hemorrhage or infarct. No mass lesion or midline shift. Gray-white matter differentiation is well maintained. Ventricles are normal in size without evidence of hydrocephalus. CSF containing spaces are within normal limits. No extra-axial fluid collection.  The calvarium is intact.  Orbital soft tissues are within normal limits. Bilateral lens implants noted.  Minimal opacity noted within the sphenoid sinuses bilaterally. Paranasal sinuses are otherwise clear. No mastoid effusion.  Scalp soft tissues are unremarkable.  IMPRESSION: 1. No acute intracranial process. 2. Mild atrophy with chronic microvascular ischemic disease, similar to previous.   Electronically Signed   By: Rise MuBenjamin  McClintock M.D.   On: 09/10/2013 15:27        Time spent:70 minutes Code Status:   FULL Family Communication:   Family at bedside   Tsutomu Barfoot, DO  [facility] Pager [phone]  If 7PM-7AM, please contact night-coverage [email or web address] Password [email or web address] 09/10/2013, 4:47 PM

## 2013-09-10 NOTE — Progress Notes (Signed)
Patient trasfered from ED  to 5W11 via wheelchair; alert and oriented x 4; no complaints of pain; IV saline locked in LFA; skin intact. Orient patient to room and unit; watch safety video; gave patient care guide; instructed how to use the call bell and  fall risk precautions. Will continue to monitor the patient.

## 2013-09-10 NOTE — Progress Notes (Signed)
CRITICAL VALUE ALERT  Critical value received:  NA 118  Date of notification:  09/10/2013  Time of notification:  2149  Critical value read back:yes  Nurse who received alert:  Judee Clararimaine Juanell Saffo  MD notified (1st page):  [place]  Time of first page:  2151  MD notified (2nd page):  Time of second page:  Responding MD:  Joneen Roachrosley  Time MD responded: 2155

## 2013-09-11 ENCOUNTER — Inpatient Hospital Stay (HOSPITAL_COMMUNITY): Payer: Medicare Other

## 2013-09-11 LAB — URINALYSIS, ROUTINE W REFLEX MICROSCOPIC
Bilirubin Urine: NEGATIVE
GLUCOSE, UA: NEGATIVE mg/dL
Hgb urine dipstick: NEGATIVE
Ketones, ur: NEGATIVE mg/dL
LEUKOCYTES UA: NEGATIVE
NITRITE: NEGATIVE
PH: 6.5 (ref 5.0–8.0)
Protein, ur: 30 mg/dL — AB
SPECIFIC GRAVITY, URINE: 1.019 (ref 1.005–1.030)
Urobilinogen, UA: 0.2 mg/dL (ref 0.0–1.0)

## 2013-09-11 LAB — BASIC METABOLIC PANEL
BUN: 15 mg/dL (ref 6–23)
BUN: 17 mg/dL (ref 6–23)
BUN: 14 mg/dL (ref 6–23)
BUN: 15 mg/dL (ref 6–23)
CALCIUM: 9.1 mg/dL (ref 8.4–10.5)
CALCIUM: 9 mg/dL (ref 8.4–10.5)
CHLORIDE: 85 meq/L — AB (ref 96–112)
CO2: 22 mEq/L (ref 19–32)
CO2: 23 mEq/L (ref 19–32)
CO2: 23 meq/L (ref 19–32)
CO2: 26 mEq/L (ref 19–32)
CREATININE: 0.79 mg/dL (ref 0.50–1.10)
CREATININE: 0.77 mg/dL (ref 0.50–1.10)
Calcium: 9 mg/dL (ref 8.4–10.5)
Calcium: 9.1 mg/dL (ref 8.4–10.5)
Chloride: 79 mEq/L — ABNORMAL LOW (ref 96–112)
Chloride: 79 mEq/L — ABNORMAL LOW (ref 96–112)
Chloride: 80 mEq/L — ABNORMAL LOW (ref 96–112)
Creatinine, Ser: 0.76 mg/dL (ref 0.50–1.10)
Creatinine, Ser: 0.79 mg/dL (ref 0.50–1.10)
GFR calc Af Amer: >90 mL/min (ref >90)
GFR calc Af Amer: >90 mL/min (ref >90)
GFR calc non Af Amer: 83 mL/min — ABNORMAL LOW (ref >90)
GFR, EST NON AFRICAN AMERICAN: 82 mL/min — AB (ref >90)
GFR, EST NON AFRICAN AMERICAN: 83 mL/min — AB (ref >90)
GFR, EST NON AFRICAN AMERICAN: 82 mL/min — AB (ref >90)
GLUCOSE: 115 mg/dL — AB (ref 70–99)
Glucose, Bld: 121 mg/dL — ABNORMAL HIGH (ref 70–99)
Glucose, Bld: 116 mg/dL — ABNORMAL HIGH (ref 70–99)
Glucose, Bld: 103 mg/dL — ABNORMAL HIGH (ref 70–99)
POTASSIUM: 4.2 meq/L (ref 3.7–5.3)
Potassium: 4.2 mEq/L (ref 3.7–5.3)
Potassium: 3.9 mEq/L (ref 3.7–5.3)
Potassium: 4.4 mEq/L (ref 3.7–5.3)
SODIUM: 118 meq/L — AB (ref 137–147)
SODIUM: 124 meq/L — AB (ref 137–147)
Sodium: 117 mEq/L — CL (ref 137–147)
Sodium: 117 mEq/L — CL (ref 137–147)

## 2013-09-11 LAB — HEMOGLOBIN A1C
HEMOGLOBIN A1C: 6.2 % — AB (ref <5.7)
Mean Plasma Glucose: 131 mg/dL — ABNORMAL HIGH (ref <117)

## 2013-09-11 LAB — GLUCOSE, CAPILLARY
GLUCOSE-CAPILLARY: 123 mg/dL — AB (ref 70–99)
Glucose-Capillary: 130 mg/dL — ABNORMAL HIGH (ref 70–99)
Glucose-Capillary: 154 mg/dL — ABNORMAL HIGH (ref 70–99)
Glucose-Capillary: 105 mg/dL — ABNORMAL HIGH (ref 70–99)

## 2013-09-11 LAB — CREATININE, URINE, RANDOM: CREATININE, URINE: 112.92 mg/dL

## 2013-09-11 LAB — SODIUM, URINE, RANDOM: Sodium, Ur: 57 mEq/L

## 2013-09-11 LAB — URINE MICROSCOPIC-ADD ON

## 2013-09-11 LAB — LIPID PANEL
Cholesterol: 167 mg/dL (ref 0–200)
HDL: 71 mg/dL (ref >39)
LDL CALC: 80 mg/dL (ref 0–99)
TRIGLYCERIDES: 79 mg/dL (ref <150)
Total CHOL/HDL Ratio: 2.4 RATIO
VLDL: 16 mg/dL (ref 0–40)

## 2013-09-11 LAB — OSMOLALITY: Osmolality: 247 mOsm/kg — ABNORMAL LOW (ref 275–300)

## 2013-09-11 LAB — OSMOLALITY, URINE: Osmolality, Ur: 495 mOsm/kg (ref 390–1090)

## 2013-09-11 MED ORDER — SODIUM CHLORIDE 3 % IV SOLN: INTRAVENOUS | Status: DC

## 2013-09-11 MED ORDER — SODIUM CHLORIDE 3 % IV SOLN
INTRAVENOUS | Status: DC
  Filled 2013-09-11: qty 500

## 2013-09-11 MED ORDER — SODIUM CHLORIDE 0.9 % IV SOLN
INTRAVENOUS | Status: DC
  Administered 2013-09-11 (×2): 1000 mL via INTRAVENOUS
  Administered 2013-09-12: 03:00:00 via INTRAVENOUS
  Administered 2013-09-12: 1000 mL via INTRAVENOUS

## 2013-09-11 NOTE — Progress Notes (Signed)
[facility] PROGRESS NOTE   Russ HaloJean E Colson ZOX:[id number] DOB: January 06, 1946 DOA: 09/10/2013 PCP: Thayer HeadingsMACKENZIE,BRIAN, MD  HPI/Subjective:   Assessment/Plan: Active Problems:   Hyponatremia   Unspecified essential hypertension   Gait instability   Type II or unspecified type diabetes mellitus without mention of complication, not stated as uncontrolled   Bipolar affective disorder   Hyponatremia  -Likely due to a combination of Trileptal, Paxil, and furosemide  -Unclear exactly why the patient is on Trileptal--she denies any previous history of seizure disorder and has never seen a neurologist (on trileptal >5344yr)  -Given the unclear reason for her Trileptal and concern for its use in seizure, what appeared to treat the patient with Keppra pending clarification  -Discontinue Paxil and furosemide  -continue the patient on 0.9 NS at 125 mL/hr -Hold off on hypertonic saline at this time, Na deficit is approx 1120 mEq  -BMP every 4 hours  -Check TSH, urine osmolarity, serum osmolarity, urine sodium, urine creatinine .  Elevated lipase  -Clinically not presenting with pancreatitis--no abdominal pain or vomiting  -Abdominal ultrasound  Hypertension  -Continue amlodipine, losartan, labetalol   Diabetes mellitus type 2  -Discontinue metformin  -Hemoglobin A1c  -NovoLog sliding scale   Bipolar disorder  -May need to add med to substitute for paxil   Gait instability  -Likely due to hyponatremia  -MRI brain if no improvement with correction of Na     Code Status: Full Code Family Communication: Plan discussed with the patient. Disposition Plan: Remains inpatient   Consultants:  None  Procedures:  None   Antibiotics:  None   Objective: Filed Vitals:   09/11/13 1108  BP: 134/82  Pulse: 60  Temp:   Resp:     Intake/Output Summary (Last 24 hours) at 09/11/13 1355 Last data filed at 09/10/13 2321  Gross per 24 hour  Intake    200 ml  Output     50 ml  Net     150 ml   Filed Weights   09/10/13 1817  Weight: 92.307 kg (203 lb 8 oz)    Exam: General: Alert and awake, oriented x3, not in any acute distress. HEENT: anicteric sclera, pupils reactive to light and accommodation, EOMI CVS: S1-S2 clear, no murmur rubs or gallops Chest: clear to auscultation bilaterally, no wheezing, rales or rhonchi Abdomen: soft nontender, nondistended, normal bowel sounds, no organomegaly Extremities: no cyanosis, clubbing or edema noted bilaterally Neuro: Cranial nerves II-XII intact, no focal neurological deficits  Data Reviewed: Basic Metabolic Panel:  Recent Labs Lab 09/10/13 1340 09/10/13 2056 09/10/13 2321 09/11/13 0155 09/11/13 0830  NA 116* 118* 117* 117* 118*  K 4.7 4.1 4.2 3.9 4.2  CL 78* 78* 79* 79* 80*  CO2 24 25 22 23 23   GLUCOSE 96 117* 115* 121* 116*  BUN 11 14 15 17 14   CREATININE 0.80 0.77 0.79 0.77 0.76  CALCIUM 9.1 8.9 9.1 9.0 9.0   Liver Function Tests:  Recent Labs Lab 09/10/13 1340  AST 121*  ALT 53*  ALKPHOS 63  BILITOT 0.3  PROT 7.2  ALBUMIN 3.7    Recent Labs Lab 09/10/13 1340  LIPASE 138*   No results found for this basename: AMMONIA,  in the last 168 hours CBC:  Recent Labs Lab 09/10/13 1340  WBC 8.4  NEUTROABS 6.2  HGB 12.0  HCT 33.4*  MCV 80.9  PLT 204   Cardiac Enzymes:  Recent Labs Lab 09/10/13 1340  TROPONINI <0.30   BNP (last 3 results) No  results found for this basename: PROBNP,  in the last 8760 hours CBG:  Recent Labs Lab 09/10/13 1348 09/10/13 2223 09/11/13 0743 09/11/13 1155  GLUCAP 101* 141* 123* 130*    Micro No results found for this or any previous visit (from the past 240 hour(s)).   Studies: Dg Chest 2 View  09/10/2013   CLINICAL DATA:  Weakness  EXAM: CHEST  2 VIEW  COMPARISON:  DG CHEST 1V PORT dated 03/28/2012; DG CHEST 1V PORT dated 05/19/2010  FINDINGS: The lungs are clear without focal infiltrate, edema, pneumothorax or pleural effusion. Interstitial  markings are diffusely coarsened with chronic features. The cardiopericardial silhouette is within normal limits for size. Imaged bony structures of the thorax are intact.  IMPRESSION: No active cardiopulmonary disease.   Electronically Signed   By: Kennith Center M.D.   On: 09/10/2013 14:41   Ct Head Wo Contrast  09/10/2013   CLINICAL DATA:  The walking  EXAM: CT HEAD WITHOUT CONTRAST  TECHNIQUE: Contiguous axial images were obtained from the base of the skull through the vertex without intravenous contrast.  COMPARISON:  Prior CT from 09/14/2008  FINDINGS: Mild atrophy with chronic microvascular ischemic changes again noted, stable.  There is no acute intracranial hemorrhage or infarct. No mass lesion or midline shift. Gray-white matter differentiation is well maintained. Ventricles are normal in size without evidence of hydrocephalus. CSF containing spaces are within normal limits. No extra-axial fluid collection.  The calvarium is intact.  Orbital soft tissues are within normal limits. Bilateral lens implants noted.  Minimal opacity noted within the sphenoid sinuses bilaterally. Paranasal sinuses are otherwise clear. No mastoid effusion.  Scalp soft tissues are unremarkable.  IMPRESSION: 1. No acute intracranial process. 2. Mild atrophy with chronic microvascular ischemic disease, similar to previous.   Electronically Signed   By: Rise Mu M.D.   On: 09/10/2013 15:27    Scheduled Meds: . amLODipine  5 mg Oral Daily  . enoxaparin (LOVENOX) injection  40 mg Subcutaneous Q24H  . insulin aspart  0-9 Units Subcutaneous TID WC  . labetalol  200 mg Oral BID  . levETIRAcetam  500 mg Oral BID  . loratadine  10 mg Oral Daily  . losartan  100 mg Oral Daily  . sodium chloride  3 mL Intravenous Q12H   Continuous Infusions: . sodium chloride 1,000 mL (09/11/13 1119)       Time spent: 35 minutes    [facility]  [facility] Pager [phone] If 7PM-7AM, please contact night-coverage  at [email or web address], password [facility] 09/11/2013, 1:55 PM  LOS: 1 day

## 2013-09-11 NOTE — Progress Notes (Deleted)
Pt arrived to unit from ED alert and oriented x4. Bariatric bed. Sacral dressing applied to bottom. Oriented to room, unit, and staff.  Bed in lowest position and call bell is within reach. Will continue to monitor.

## 2013-09-11 NOTE — Progress Notes (Signed)
Nutrition Brief Note  RD consulted to assess patient. Discussed current fluid restriction with patient and reviewed usual fluid intake. Pt normally consumes 4-6 glasses of 12 oz of water daily. Discussed current fluid restriction of 1500 ml daily. Encouraged pt to discuss need for fluid restriction with MD. Recently started MediFast for weight loss about 2-3 weeks ago.   Body mass index is 34.4 kg/(m^2). Patient meets criteria for Obese Class I based on current BMI.   Current diet order is Heart Healthy. Questioned need for low sodium diet with critically low sodium levels with Dr. Arthor CaptainElmahi, who has changed pt to Regular diet. Labs and medications reviewed.   No nutrition interventions warranted at this time. If nutrition issues arise, please consult RD.   Yvonne MottoSamantha Axavier Pressley MS, RD, LDN Inpatient Registered Dietitian Pager: [phone] After-hours pager: [phone]

## 2013-09-11 NOTE — Progress Notes (Signed)
CRITICAL VALUE ALERT  Critical value received: Na 117  Date of notification:  09/11/2013  Time of notification:  0250  Critical value read back:yes  Nurse who received alert:  Judee Clararimaine Divit Stipp  MD notified (1st page):  Crosley  Time of first page:  0252  MD notified (2nd page): Crosley  Time of second page: 0325  Responding MD:  Joneen Roachrosley  Time MD responded:  [phone]

## 2013-09-11 NOTE — Progress Notes (Signed)
CRITICAL VALUE ALERT  Critical value received:  NA 117  Date of notification:  09/11/2013  Time of notification:  0015  Critical value read back:yes  Nurse who received alert:  Judee Clararimaine Jaxson Anglin  MD notified (1st page):  Crosley  Time of first page:  0017  MD notified (2nd page):  Time of second page:  Responding MD:  Joneen Roachrosley  Time MD responded:  [phone]

## 2013-09-11 NOTE — Plan of Care (Signed)
Problem: Phase I Progression Outcomes Goal: Initial discharge plan identified Outcome: Completed/Met Date Met:  09/11/13 To return home with husband

## 2013-09-11 NOTE — Progress Notes (Signed)
CRITICAL VALUE ALERT  Critical value received:  Na+ 118  Date of notification:  09/11/13  Time of notification:  1154  Critical value read back:yes  Nurse who received alert:  Forbes Cellarelcine Mackinzee Roszak, RN  MD notified (1st page):  Dr. Arthor CaptainElmahi  Time of first page:  1155  MD notified (2nd page):  Time of second page:  Responding MD:  Dr. Arthor CaptainElmahi  Time MD responded:  1155

## 2013-09-11 NOTE — Care Management Note (Unsigned)
    Page 1 of 1   09/11/2013     10:53:31 AM   CARE MANAGEMENT NOTE 09/11/2013  Patient:  Russ HaloLLEN,Vrinda E   Account Number:  [id number]  Date Initiated:  09/11/2013  Documentation initiated by:  Letha CapeAYLOR,Madissen Wyse  Subjective/Objective Assessment:   dx hyponatremia, diff walking  admit- lives with spouse.     Action/Plan:   pt eval   Anticipated DC Date:  09/13/2013   Anticipated DC Plan:  HOME W HOME HEALTH SERVICES      DC Planning Services  CM consult      Choice offered to / List presented to:             Status of service:  In process, will continue to follow Medicare Important Message given?   (If response is "NO", the following Medicare IM given date fields will be blank) Date Medicare IM given:   Date Additional Medicare IM given:    Discharge Disposition:    Per UR Regulation:    If discussed at Long Length of Stay Meetings, dates discussed:    Comments:  09/11/13 1051 Letha Capeeborah Kyarra Vancamp RN, BSN [phone] patient lives with spouse, has NA of 117, having difficulty ambulating, await pt eval.

## 2013-09-11 NOTE — Progress Notes (Signed)
CRITICAL VALUE ALERT  Critical value received:  Serum osmolality 247  Date of notification:  09/11/2013  Time of notification:  0158  Critical value read back:yes  Nurse who received alert:  Judee Clararimaine Dariush Mcnellis  MD notified (1st page):  Crosley  Time of first page:  0200  MD notified (2nd page):  Time of second page:  Responding MD:  Joneen Roachrosley  Time MD responded:  [phone]

## 2013-09-12 DIAGNOSIS — G4733 Obstructive sleep apnea (adult) (pediatric): Secondary | ICD-10-CM

## 2013-09-12 DIAGNOSIS — I517 Cardiomegaly: Secondary | ICD-10-CM

## 2013-09-12 DIAGNOSIS — E669 Obesity, unspecified: Secondary | ICD-10-CM

## 2013-09-12 LAB — URINALYSIS, ROUTINE W REFLEX MICROSCOPIC
Bilirubin Urine: NEGATIVE
GLUCOSE, UA: NEGATIVE mg/dL
Hgb urine dipstick: NEGATIVE
KETONES UR: NEGATIVE mg/dL
Leukocytes, UA: NEGATIVE
Nitrite: NEGATIVE
PROTEIN: NEGATIVE mg/dL
Specific Gravity, Urine: 1.009 (ref 1.005–1.030)
UROBILINOGEN UA: 0.2 mg/dL (ref 0.0–1.0)
pH: 6.5 (ref 5.0–8.0)

## 2013-09-12 LAB — BASIC METABOLIC PANEL
BUN: 15 mg/dL (ref 6–23)
BUN: 14 mg/dL (ref 6–23)
BUN: 13 mg/dL (ref 6–23)
BUN: 13 mg/dL (ref 6–23)
CALCIUM: 9 mg/dL (ref 8.4–10.5)
CALCIUM: 9 mg/dL (ref 8.4–10.5)
CO2: 27 mEq/L (ref 19–32)
CO2: 22 meq/L (ref 19–32)
CO2: 23 meq/L (ref 19–32)
CO2: 22 mEq/L (ref 19–32)
CREATININE: 0.78 mg/dL (ref 0.50–1.10)
CREATININE: 0.69 mg/dL (ref 0.50–1.10)
CREATININE: 0.77 mg/dL (ref 0.50–1.10)
Calcium: 8.8 mg/dL (ref 8.4–10.5)
Calcium: 8.9 mg/dL (ref 8.4–10.5)
Chloride: 87 mEq/L — ABNORMAL LOW (ref 96–112)
Chloride: 91 mEq/L — ABNORMAL LOW (ref 96–112)
Chloride: 96 mEq/L (ref 96–112)
Chloride: 95 mEq/L — ABNORMAL LOW (ref 96–112)
Creatinine, Ser: 0.81 mg/dL (ref 0.50–1.10)
GFR calc Af Amer: >90 mL/min (ref >90)
GFR calc Af Amer: >90 mL/min (ref >90)
GFR calc Af Amer: 83 mL/min — ABNORMAL LOW (ref >90)
GFR calc Af Amer: >90 mL/min (ref >90)
GFR calc non Af Amer: 72 mL/min — ABNORMAL LOW (ref >90)
GFR, EST NON AFRICAN AMERICAN: 83 mL/min — AB (ref >90)
GFR, EST NON AFRICAN AMERICAN: 86 mL/min — AB (ref >90)
GFR, EST NON AFRICAN AMERICAN: 83 mL/min — AB (ref >90)
GLUCOSE: 112 mg/dL — AB (ref 70–99)
GLUCOSE: 127 mg/dL — AB (ref 70–99)
GLUCOSE: 118 mg/dL — AB (ref 70–99)
Glucose, Bld: 120 mg/dL — ABNORMAL HIGH (ref 70–99)
POTASSIUM: 4.9 meq/L (ref 3.7–5.3)
POTASSIUM: 5.2 meq/L (ref 3.7–5.3)
Potassium: 4.7 mEq/L (ref 3.7–5.3)
Potassium: 5 mEq/L (ref 3.7–5.3)
SODIUM: 126 meq/L — AB (ref 137–147)
SODIUM: 132 meq/L — AB (ref 137–147)
Sodium: 124 mEq/L — ABNORMAL LOW (ref 137–147)
Sodium: 129 mEq/L — ABNORMAL LOW (ref 137–147)

## 2013-09-12 LAB — HEPATIC FUNCTION PANEL
ALBUMIN: 3.1 g/dL — AB (ref 3.5–5.2)
ALT: 52 U/L — ABNORMAL HIGH (ref 0–35)
AST: 92 U/L — ABNORMAL HIGH (ref 0–37)
Alkaline Phosphatase: 52 U/L (ref 39–117)
Bilirubin, Direct: <0.2 mg/dL (ref 0.0–0.3)
Total Protein: 6.3 g/dL (ref 6.0–8.3)

## 2013-09-12 LAB — HEPATITIS PANEL, ACUTE
HCV AB: NEGATIVE
HEP B S AG: NEGATIVE
Hep A IgM: NONREACTIVE
Hep B C IgM: NONREACTIVE

## 2013-09-12 LAB — CORTISOL: Cortisol, Plasma: 10 ug/dL

## 2013-09-12 LAB — GLUCOSE, CAPILLARY
Glucose-Capillary: 117 mg/dL — ABNORMAL HIGH (ref 70–99)
Glucose-Capillary: 139 mg/dL — ABNORMAL HIGH (ref 70–99)

## 2013-09-12 NOTE — Discharge Summary (Signed)
Physician Discharge Summary  JAYCE BOYKO ZOX:[id number] DOB: 04/28/1946 DOA: 09/10/2013  PCP: Thayer Headings, MD  Admit date: 09/10/2013 Discharge date: 09/12/2013  Time spent: 40 minutes  Recommendations for Outpatient Follow-up:  1. Followup with primary care physician as needed.  Discharge Diagnoses:  Active Problems:   Hyponatremia   Unspecified essential hypertension   Gait instability   Type II or unspecified type diabetes mellitus without mention of complication, not stated as uncontrolled   Bipolar affective disorder   Discharge Condition: Stable  Diet recommendation: Regular  Filed Weights   09/10/13 1817  Weight: 92.307 kg (203 lb 8 oz)    History of present illness:  68 year old female with history of bipolar disorder, diabetes mellitus type 2, hypertension, and sleep apnea presents with four-day history of generalized weakness and gait instability. The patient states that this began approximately 09/06/2013 and worsened on a daily basis. She has not suffered any falls or syncope. She did admit to some intermittent nausea but has not had any vomiting or diarrhea. She denies any abdominal pain. She has not had any fevers, chills, chest discomfort,, abdominal pain, dysuria, hematuria, rashes, focal extremity weakness, visual disturbance. She denies any headaches. The patient went to see her general surgeon today and was noted to have significant gait instability. As a result, EMS was activated. The patient was brought to the emergency department. The patient was noted to have a serum sodium of 116. The remainder of her electrolytes were unremarkable. CBC was negative. The patient was hemodynamically stable. AST 121, ALT 53, lipase 138. Initial troponin was negative. Chest x-ray was negative. CT of the brain was negative for acute changes.  Hospital Course:   Hyponatremia  -Likely due to a dehydration and furosemide, Trileptal and Paxil might be contributing as  well. -Discontinue Paxil and furosemide  -FENa was not very helpful as patient was on diuretics already. -BMP every 4 hours, patient appears dehydrated.  -Serum osmolality and clinical dehydration was apparent. -Patient started on normal saline at 125 mL per hour. -Insisting on discharge, sodium is back to reasonable level at 132. -Patient presents with sodium of 116, sodium improved to one hundred thirty-two over three of 48 hours. -The correction rate never exceeded 0.5 mEq per hour. Lasix discontinued on discharge.  Elevated lipase  -Clinically not presenting with pancreatitis--no abdominal pain or vomiting  -Abdominal ultrasound appears normal with post cholecystectomy CBD dilatation.  Hypertension  -Continue amlodipine, losartan, labetalol. -Blood pressure was reasonable, early morning systolic blood pressure was 150-160. -Patient might need further adjustment of hypertensive regimen as outpatient.  Diabetes mellitus type 2  -Metformin held while she is in the hospital, restarted on discharge. -Controlled diabetes mellitus with hemoglobin A1c of 6.2. ` Bipolar disorder  -May need to add med to substitute for paxil   Gait instability  -Gait instability is likely secondary to hyponatremia, CT of the brain showed no cranial findings. -After hydration and correction of sodium patient seen by PT and recommended no followup.    Procedures:  None  Consultations:  None  Discharge Exam: Filed Vitals:   09/12/13 0509  BP: 154/89  Pulse: 61  Temp: 98 F (36.7 C)  Resp: 18   General: Alert and awake, oriented x3, not in any acute distress. HEENT: anicteric sclera, pupils reactive to light and accommodation, EOMI CVS: S1-S2 clear, no murmur rubs or gallops Chest: clear to auscultation bilaterally, no wheezing, rales or rhonchi Abdomen: soft nontender, nondistended, normal bowel sounds, no organomegaly Extremities: no cyanosis, clubbing  or edema noted bilaterally Neuro:  Cranial nerves II-XII intact, no focal neurological deficits  Discharge Instructions You were cared for by a hospitalist during your hospital stay. If you have any questions about your discharge medications or the care you received while you were in the hospital after you are discharged, you can call the unit and asked to speak with the hospitalist on call if the hospitalist that took care of you is not available. Once you are discharged, your primary care physician will handle any further medical issues. Please note that NO REFILLS for any discharge medications will be authorized once you are discharged, as it is imperative that you return to your primary care physician (or establish a relationship with a primary care physician if you do not have one) for your aftercare needs so that they can reassess your need for medications and monitor your lab values.  Discharge Orders   Future Orders Complete By Expires   Increase activity slowly  As directed        Medication List    STOP taking these medications       furosemide 20 MG tablet  Commonly known as:  LASIX      TAKE these medications       amLODipine 5 MG tablet  Commonly known as:  NORVASC  Take 5 mg by mouth daily.     cetirizine 10 MG tablet  Commonly known as:  ZYRTEC  Take 10 mg by mouth daily.     labetalol 200 MG tablet  Commonly known as:  NORMODYNE  Take 200 mg by mouth 2 (two) times daily.     losartan 100 MG tablet  Commonly known as:  COZAAR  Take 100 mg by mouth daily.     metFORMIN 750 MG 24 hr tablet  Commonly known as:  GLUCOPHAGE-XR  Take 750 mg by mouth 2 (two) times daily.     oxcarbazepine 600 MG tablet  Commonly known as:  TRILEPTAL  Take 600 mg by mouth 2 (two) times daily.     PARoxetine 40 MG tablet  Commonly known as:  PAXIL  Take 40 mg by mouth every morning.       Allergies  Allergen Reactions  . Sulfa Antibiotics Anaphylaxis and Swelling    Swelling in nasal passage        Follow-up Information   Follow up with Thayer HeadingsMACKENZIE,BRIAN, MD.   Specialty:  Internal Medicine   Contact information:   [address], [address] [place] [address] [phone]        The results of significant diagnostics from this hospitalization (including imaging, microbiology, ancillary and laboratory) are listed below for reference.    Significant Diagnostic Studies: Dg Chest 2 View  09/10/2013   CLINICAL DATA:  Weakness  EXAM: CHEST  2 VIEW  COMPARISON:  DG CHEST 1V PORT dated 03/28/2012; DG CHEST 1V PORT dated 05/19/2010  FINDINGS: The lungs are clear without focal infiltrate, edema, pneumothorax or pleural effusion. Interstitial markings are diffusely coarsened with chronic features. The cardiopericardial silhouette is within normal limits for size. Imaged bony structures of the thorax are intact.  IMPRESSION: No active cardiopulmonary disease.   Electronically Signed   By: Kennith CenterEric  Mansell M.D.   On: 09/10/2013 14:41   Ct Head Wo Contrast  09/10/2013   CLINICAL DATA:  The walking  EXAM: CT HEAD WITHOUT CONTRAST  TECHNIQUE: Contiguous axial images were obtained from the base of the skull through the vertex without intravenous contrast.  COMPARISON:  Prior CT from 09/14/2008  FINDINGS: Mild atrophy with chronic microvascular ischemic changes again noted, stable.  There is no acute intracranial hemorrhage or infarct. No mass lesion or midline shift. Gray-white matter differentiation is well maintained. Ventricles are normal in size without evidence of hydrocephalus. CSF containing spaces are within normal limits. No extra-axial fluid collection.  The calvarium is intact.  Orbital soft tissues are within normal limits. Bilateral lens implants noted.  Minimal opacity noted within the sphenoid sinuses bilaterally. Paranasal sinuses are otherwise clear. No mastoid effusion.  Scalp soft tissues are unremarkable.  IMPRESSION: 1. No acute intracranial process. 2. Mild atrophy with chronic  microvascular ischemic disease, similar to previous.   Electronically Signed   By: Rise Mu M.D.   On: 09/10/2013 15:27   US Abdomen Complete  09/11/2013   CLINICAL DATA:  Elevated lipase/LFTs.  EXAM: ULTRASOUND ABDOMEN COMPLETE  COMPARISON:  None.  FINDINGS: Gallbladder:  Surgically removed.  Common bile duct:  Diameter: Proximal aspect 7 mm. Mid to distal aspect not visualized secondary to bowel gas.  Liver:  Question mild fatty infiltration.  1.8 cm cyst right lobe liver.  IVC:  No abnormality visualized.  Pancreas:  Portions are poorly visualized secondary to bowel gas. The portions which are visualized appear unremarkable.  Spleen:  Size and appearance within normal limits.  Right Kidney:  Length: 11.7 cm.  2.3 cm cyst lower pole region.  Left Kidney:  Length: 11.1 cm. Echogenicity within normal limits. No mass or hydronephrosis visualized.  Abdominal aorta:  Portions of the aorta not adequately assessed secondary to bowel gas. The portions which are visualized are unremarkable  Other findings:  None.  IMPRESSION: Mid to distal common bile duct, portions of the pancreas and portions of the abdominal aorta are not well delineated secondary to bowel gas. The portions which are visualized appear unremarkable.  Post cholecystectomy.  Question mild fatty infiltration liver.  1.8 cm right lobe of liver cyst and 2.3 cm lower pole right renal cyst.   Electronically Signed   By: Bridgett Larsson M.D.   On: 09/11/2013 15:13    Microbiology: No results found for this or any previous visit (from the past 240 hour(s)).   Labs: Basic Metabolic Panel:  Recent Labs Lab 09/11/13 0830 09/11/13 2215 09/12/13 0104 09/12/13 0537 09/12/13 0959  NA 118* 124* 124* 126* 132*  K 4.2 4.4 4.9 4.7 5.0  CL 80* 85* 87* 91* 96  CO2 23 26 27 22 23   GLUCOSE 116* 103* 120* 112* 127*  BUN 14 15 15 14 13   CREATININE 0.76 0.79 0.78 0.69 0.81  CALCIUM 9.0 9.1 8.8 9.0 9.0   Liver Function Tests:  Recent Labs Lab  09/10/13 1340 09/12/13 0537  AST 121* 92*  ALT 53* 52*  ALKPHOS 63 52  BILITOT 0.3 <0.2*  PROT 7.2 6.3  ALBUMIN 3.7 3.1*    Recent Labs Lab 09/10/13 1340  LIPASE 138*   No results found for this basename: AMMONIA,  in the last 168 hours CBC:  Recent Labs Lab 09/10/13 1340  WBC 8.4  NEUTROABS 6.2  HGB 12.0  HCT 33.4*  MCV 80.9  PLT 204   Cardiac Enzymes:  Recent Labs Lab 09/10/13 1340  TROPONINI <0.30   BNP: BNP (last 3 results) No results found for this basename: PROBNP,  in the last 8760 hours CBG:  Recent Labs Lab 09/11/13 1155 09/11/13 1713 09/11/13 2045 09/12/13 0730 09/12/13 1150  GLUCAP 130* 154* 105* 117* 139*  Signed:  Polina Burmaster A  [facility] 09/12/2013, 1:40 PM

## 2013-09-12 NOTE — Progress Notes (Signed)
Patient was discharged home by MD order; discharged instructions  review and give to patient with care notes; IV DIC; skin intact; patient will be escorted to the car by nurse tech via wheelchair.  

## 2013-09-12 NOTE — Evaluation (Signed)
Physical Therapy Evaluation Patient Details Name: Russ HaloJean E Dardis MRN: [id number] DOB: 11-24-1945 Today's Date: 09/12/2013   History of Present Illness  Pt adm with hyponatremia.  Clinical Impression  Pt doing well with mobility and no further PT needed.  Ready for dc from PT standpoint.      Follow Up Recommendations No PT follow up    Equipment Recommendations  None recommended by PT    Recommendations for Other Services       Precautions / Restrictions Precautions Precautions: None      Mobility  Bed Mobility                  Transfers Overall transfer level: Independent                  Ambulation/Gait Ambulation/Gait assistance: Independent Ambulation Distance (Feet): 250 Feet Assistive device: None (or pushing IV pole) Gait Pattern/deviations: WFL(Within Functional Limits)   Gait velocity interpretation: at or above normal speed for age/gender    Stairs            Wheelchair Mobility    Modified Rankin (Stroke Patients Only)       Balance Overall balance assessment: No apparent balance deficits (not formally assessed)                                           Pertinent Vitals/Pain N/A    Home Living Family/patient expects to be discharged to:: Private residence Living Arrangements: Spouse/significant other;Children     Home Access: Stairs to enter   Secretary/administratorntrance Stairs-Number of Steps: 2-3 Home Layout: One level        Prior Function Level of Independence: Independent               Hand Dominance        Extremity/Trunk Assessment   Upper Extremity Assessment: Overall WFL for tasks assessed           Lower Extremity Assessment: Overall WFL for tasks assessed         Communication   Communication: No difficulties  Cognition Arousal/Alertness: Awake/alert Behavior During Therapy: WFL for tasks assessed/performed Overall Cognitive Status: Within Functional Limits for tasks assessed                       General Comments      Exercises        Assessment/Plan    PT Assessment Patent does not need any further PT services  PT Diagnosis     PT Problem List    PT Treatment Interventions     PT Goals (Current goals can be found in the Care Plan section) Acute Rehab PT Goals PT Goal Formulation: No goals set, d/c therapy    Frequency     Barriers to discharge        Co-evaluation               End of Session   Activity Tolerance: Patient tolerated treatment well Patient left: in chair;with call bell/phone within reach           Time: 1154-1202 PT Time Calculation (min): 8 min   Charges:   PT Evaluation $Initial PT Evaluation Tier I: 1 Procedure     PT G Codes:          Cyris Maalouf 09/12/2013, 12:21 PM  [facility] Vesper [facility] PT [phone]

## 2014-08-12 ENCOUNTER — Encounter (HOSPITAL_COMMUNITY): Payer: Self-pay

## 2014-08-12 ENCOUNTER — Emergency Department (HOSPITAL_COMMUNITY)
Admission: EM | Admit: 2014-08-12 | Discharge: 2014-08-12 | Disposition: A | Discharge status: Home / Self Care | Payer: Medicare Other | Attending: Emergency Medicine | Admitting: Emergency Medicine

## 2014-08-12 DIAGNOSIS — F319 Bipolar disorder, unspecified: Secondary | ICD-10-CM | POA: Insufficient documentation

## 2014-08-12 DIAGNOSIS — Z79899 Other long term (current) drug therapy: Secondary | ICD-10-CM | POA: Diagnosis not present

## 2014-08-12 DIAGNOSIS — F419 Anxiety disorder, unspecified: Secondary | ICD-10-CM | POA: Diagnosis not present

## 2014-08-12 DIAGNOSIS — Z862 Personal history of diseases of the blood and blood-forming organs and certain disorders involving the immune mechanism: Secondary | ICD-10-CM | POA: Diagnosis not present

## 2014-08-12 DIAGNOSIS — G4733 Obstructive sleep apnea (adult) (pediatric): Secondary | ICD-10-CM | POA: Diagnosis not present

## 2014-08-12 DIAGNOSIS — Y9389 Activity, other specified: Secondary | ICD-10-CM | POA: Diagnosis not present

## 2014-08-12 DIAGNOSIS — G43909 Migraine, unspecified, not intractable, without status migrainosus: Secondary | ICD-10-CM | POA: Diagnosis not present

## 2014-08-12 DIAGNOSIS — R220 Localized swelling, mass and lump, head: Secondary | ICD-10-CM | POA: Diagnosis present

## 2014-08-12 DIAGNOSIS — E119 Type 2 diabetes mellitus without complications: Secondary | ICD-10-CM | POA: Insufficient documentation

## 2014-08-12 DIAGNOSIS — T7840XA Allergy, unspecified, initial encounter: Secondary | ICD-10-CM | POA: Diagnosis not present

## 2014-08-12 DIAGNOSIS — Y998 Other external cause status: Secondary | ICD-10-CM | POA: Diagnosis not present

## 2014-08-12 DIAGNOSIS — X58XXXA Exposure to other specified factors, initial encounter: Secondary | ICD-10-CM | POA: Insufficient documentation

## 2014-08-12 DIAGNOSIS — Z9981 Dependence on supplemental oxygen: Secondary | ICD-10-CM | POA: Diagnosis not present

## 2014-08-12 DIAGNOSIS — Y9289 Other specified places as the place of occurrence of the external cause: Secondary | ICD-10-CM | POA: Diagnosis not present

## 2014-08-12 DIAGNOSIS — M159 Polyosteoarthritis, unspecified: Secondary | ICD-10-CM | POA: Diagnosis not present

## 2014-08-12 DIAGNOSIS — I10 Essential (primary) hypertension: Secondary | ICD-10-CM | POA: Insufficient documentation

## 2014-08-12 DIAGNOSIS — K219 Gastro-esophageal reflux disease without esophagitis: Secondary | ICD-10-CM | POA: Diagnosis not present

## 2014-08-12 MED ORDER — FAMOTIDINE 20 MG PO TABS
20.0000 mg | ORAL_TABLET | Freq: Once | ORAL | Status: AC
  Administered 2014-08-12: 20 mg via ORAL
  Filled 2014-08-12: qty 1

## 2014-08-12 MED ORDER — FAMOTIDINE 20 MG PO TABS: 20.0000 mg | ORAL_TABLET | Freq: Two times a day (BID) | ORAL | Status: AC

## 2014-08-12 MED ORDER — PREDNISONE 20 MG PO TABS: 40.0000 mg | ORAL_TABLET | Freq: Every day | ORAL | Status: DC

## 2014-08-12 MED ORDER — DIPHENHYDRAMINE HCL 25 MG PO CAPS
25.0000 mg | ORAL_CAPSULE | Freq: Once | ORAL | Status: AC
  Administered 2014-08-12: 25 mg via ORAL
  Filled 2014-08-12: qty 1

## 2014-08-12 MED ORDER — DIPHENHYDRAMINE HCL 25 MG PO TABS: 25.0000 mg | ORAL_TABLET | Freq: Four times a day (QID) | ORAL | Status: AC | PRN

## 2014-08-12 MED ORDER — PREDNISONE 20 MG PO TABS
40.0000 mg | ORAL_TABLET | Freq: Once | ORAL | Status: AC
  Administered 2014-08-12: 40 mg via ORAL
  Filled 2014-08-12: qty 2

## 2014-08-12 NOTE — Discharge Instructions (Signed)
Go to your doctors office to obtain allergy testing - refrain from the offending foods until you get tested.  Prdnisone once a day for 5 days  Benadryl as needed for itching  Pepcid nightly.   Allergies Allergies may happen from anything your body is sensitive to. This may be food, medicines, pollens, chemicals, and nearly anything around you in everyday life that produces allergens. An allergen is anything that causes an allergy producing substance. Heredity is often a factor in causing these problems. This means you may have some of the same allergies as your parents. Food allergies happen in all age groups. Food allergies are some of the most severe and life threatening. Some common food allergies are cow's milk, seafood, eggs, nuts, wheat, and soybeans. SYMPTOMS   Swelling around the mouth.  An itchy red rash or hives.  Vomiting or diarrhea.  Difficulty breathing. SEVERE ALLERGIC REACTIONS ARE LIFE-THREATENING. This reaction is called anaphylaxis. It can cause the mouth and throat to swell and cause difficulty with breathing and swallowing. In severe reactions only a trace amount of food (for example, peanut oil in a salad) may cause death within seconds. Seasonal allergies occur in all age groups. These are seasonal because they usually occur during the same season every year. They may be a reaction to molds, grass pollens, or tree pollens. Other causes of problems are house dust mite allergens, pet dander, and mold spores. The symptoms often consist of nasal congestion, a runny itchy nose associated with sneezing, and tearing itchy eyes. There is often an associated itching of the mouth and ears. The problems happen when you come in contact with pollens and other allergens. Allergens are the particles in the air that the body reacts to with an allergic reaction. This causes you to release allergic antibodies. Through a chain of events, these eventually cause you to release histamine into  the blood stream. Although it is meant to be protective to the body, it is this release that causes your discomfort. This is why you were given anti-histamines to feel better. If you are unable to pinpoint the offending allergen, it may be determined by skin or blood testing. Allergies cannot be cured but can be controlled with medicine. Hay fever is a collection of all or some of the seasonal allergy problems. It may often be treated with simple over-the-counter medicine such as diphenhydramine. Take medicine as directed. Do not drink alcohol or drive while taking this medicine. Check with your caregiver or package insert for child dosages. If these medicines are not effective, there are many new medicines your caregiver can prescribe. Stronger medicine such as nasal spray, eye drops, and corticosteroids may be used if the first things you try do not work well. Other treatments such as immunotherapy or desensitizing injections can be used if all else fails. Follow up with your caregiver if problems continue. These seasonal allergies are usually not life threatening. They are generally more of a nuisance that can often be handled using medicine. HOME CARE INSTRUCTIONS   If unsure what causes a reaction, keep a diary of foods eaten and symptoms that follow. Avoid foods that cause reactions.  If hives or rash are present:  Take medicine as directed.  You may use an over-the-counter antihistamine (diphenhydramine) for hives and itching as needed.  Apply cold compresses (cloths) to the skin or take baths in cool water. Avoid hot baths or showers. Heat will make a rash and itching worse.  If you are severely allergic:  Following a treatment for a severe reaction, hospitalization is often required for closer follow-up.  Wear a medic-alert bracelet or necklace stating the allergy.  You and your family must learn how to give adrenaline or use an anaphylaxis kit.  If you have had a severe reaction,  always carry your anaphylaxis kit or EpiPen with you. Use this medicine as directed by your caregiver if a severe reaction is occurring. Failure to do so could have a fatal outcome. SEEK MEDICAL CARE IF:  You suspect a food allergy. Symptoms generally happen within 30 minutes of eating a food.  Your symptoms have not gone away within 2 days or are getting worse.  You develop new symptoms.  You want to retest yourself or your child with a food or drink you think causes an allergic reaction. Never do this if an anaphylactic reaction to that food or drink has happened before. Only do this under the care of a caregiver. SEEK IMMEDIATE MEDICAL CARE IF:   You have difficulty breathing, are wheezing, or have a tight feeling in your chest or throat.  You have a swollen mouth, or you have hives, swelling, or itching all over your body.  You have had a severe reaction that has responded to your anaphylaxis kit or an EpiPen. These reactions may return when the medicine has worn off. These reactions should be considered life threatening. MAKE SURE YOU:   Understand these instructions.  Will watch your condition.  Will get help right away if you are not doing well or get worse. Document Released: 08/22/2002 Document Revised: 09/23/2012 Document Reviewed: 01/27/2008 [facility] Patient Information 2015 [place], [place]. This information is not intended to replace advice given to you by your health care provider. Make sure you discuss any questions you have with your health care provider.

## 2014-08-12 NOTE — ED Notes (Signed)
Pt ambulatory and A&Ox4, questions concerns denied r/t dc. Pt has scheduled f/u visit 3/18 at 730 am

## 2014-08-12 NOTE — ED Provider Notes (Signed)
CSN: [id number]     Arrival date & time 08/12/14  0801 History   First MD Initiated Contact with Patient 08/12/14 0813     Chief Complaint  Patient presents with  . Allergic Reaction  . Facial Swelling     (Consider location/radiation/quality/duration/timing/severity/associated sxs/prior Treatment) HPI Comments: 69 year old female, history of hypertension, bipolar disorder, presents to the hospital with a rash and itching as well as some facial swelling. She reports waking up yesterday morning with a feeling of itchiness on her face and arms but no rashes at that time. She went to the store and bought some Coricidin cough medication, this did not help her symptoms nor make them worse. She states that she had a new food 2 nights ago before this started which she had never had before. This morning she awoke and had swelling around her lips and eyes, some rash had now started to form on her arms and legs which she describes as red and itchy. The symptoms are persistent, nothing makes this better, she did not take Benadryl or any other allergy medicines prior to arrival.  Patient is a 69 y.o. female presenting with allergic reaction. The history is provided by the patient.  Allergic Reaction Presenting symptoms: rash   Presenting symptoms: no wheezing     Past Medical History  Diagnosis Date  . Complication of anesthesia     panic attacks  . PONV (postoperative nausea and vomiting)   . Hypertension   . Bipolar affective   . GERD (gastroesophageal reflux disease)   . OSA on CPAP 03/29/2012  . Type II diabetes mellitus   . Anemia   . History of blood transfusion     "S/P back OR"  . Migraines     "years ago" (09/10/2013)  . Arthritis     "hands; knees" (09/10/2013)  . Anxiety   . Depression   . Gait instability 09/06/2013    admitted 09/10/2013   Past Surgical History  Procedure Laterality Date  . Cataract extraction w/ intraocular lens  implant, bilateral  2012-2013    left eye - Nov.  2012, right eye - Mar. 2013  . Total knee arthroplasty Bilateral 2010-2011    Left knee - Dec. 2010, right kee - March 2011  . Cholecystectomy  ~ 1989  . Bladder suspension  1992  . Appendectomy  June 1967  . Tonsillectomy  1955  . Nasal sinus surgery  1978  . Ovarian cyst removal  1967  . Carpal tunnel release Right approx. 2000  . Posterior lumbar fusion  12/2012  . Hemorrhoid banding  08/2013  . Back surgery     Family History  Problem Relation Age of Onset  . Coronary artery disease Mother   . Heart failure Mother   . Heart disease Mother   . Coronary artery disease Father   . Cancer Father     kidney  . Cancer Brother     brain   History  Substance Use Topics  . Smoking status: Never Smoker   . Smokeless tobacco: Never Used  . Alcohol Use: Yes     Comment: 09/10/2013 "very rarely - 1 to 2 times per year"   OB History    No data available     Review of Systems  Constitutional: Negative for fever and chills.  HENT: Negative for congestion, mouth sores and sore throat.   Eyes: Negative for redness.  Respiratory: Negative for cough, shortness of breath and wheezing.   Cardiovascular: Negative for  chest pain and leg swelling.  Gastrointestinal: Negative for nausea and vomiting.  Endocrine: Negative for polyuria.  Genitourinary: Negative for flank pain.  Skin: Positive for rash.  Neurological: Negative for weakness, numbness and headaches.  Hematological: Negative for adenopathy.      Allergies  Sulfa antibiotics  Home Medications   Prior to Admission medications   Medication Sig Start Date End Date Taking? Authorizing Provider  amLODipine (NORVASC) 5 MG tablet Take 5 mg by mouth daily.   Yes Historical Provider, MD  cetirizine (ZYRTEC) 10 MG tablet Take 10 mg by mouth daily.   Yes Historical Provider, MD  Chlorpheniramine-Acetaminophen (CORICIDIN HBP COLD/FLU PO) Take 1 tablet by mouth 2 (two) times daily. Maximum Strength   Yes Historical Provider, MD   labetalol (NORMODYNE) 200 MG tablet Take 200 mg by mouth 2 (two) times daily.   Yes Historical Provider, MD  losartan (COZAAR) 100 MG tablet Take 100 mg by mouth daily.   Yes Historical Provider, MD  metFORMIN (GLUCOPHAGE-XR) 750 MG 24 hr tablet Take 750 mg by mouth daily with breakfast.    Yes Historical Provider, MD  oxcarbazepine (TRILEPTAL) 600 MG tablet Take 600 mg by mouth 2 (two) times daily.   Yes Historical Provider, MD  PARoxetine (PAXIL) 40 MG tablet Take 40 mg by mouth every morning.   Yes Historical Provider, MD  diphenhydrAMINE (BENADRYL) 25 MG tablet Take 1 tablet (25 mg total) by mouth every 6 (six) hours as needed for itching (Rash). 08/12/14   Vida RollerBrian D Prajna Vanderpool, MD  famotidine (PEPCID) 20 MG tablet Take 1 tablet (20 mg total) by mouth 2 (two) times daily. 08/12/14   Vida RollerBrian D Bralyn Folkert, MD  predniSONE (DELTASONE) 20 MG tablet Take 2 tablets (40 mg total) by mouth daily. 08/12/14   Vida RollerBrian D Ciara Kagan, MD   BP 154/70 mmHg  Pulse 77  Temp(Src) 98.1 F (36.7 C) (Oral)  Resp 16  SpO2 95% Physical Exam  Constitutional: She appears well-developed and well-nourished. No distress.  HENT:  Head: Normocephalic and atraumatic.  Mouth/Throat: Oropharynx is clear and moist. No oropharyngeal exudate.  Mild swelling of the upper lip, bilateral periorbital areas have mild edema, no sores in the intraoral cavity, several urticarial lesions on the face  Eyes: Conjunctivae and EOM are normal. Pupils are equal, round, and reactive to light. Right eye exhibits no discharge. Left eye exhibits no discharge. No scleral icterus.  Neck: Normal range of motion. Neck supple. No JVD present. No thyromegaly present.  Cardiovascular: Normal rate, regular rhythm, normal heart sounds and intact distal pulses.  Exam reveals no gallop and no friction rub.   No murmur heard. Pulmonary/Chest: Effort normal and breath sounds normal. No respiratory distress. She has no wheezes. She has no rales.  Abdominal: Soft. Bowel sounds  are normal. She exhibits no distension and no mass. There is no tenderness.  Musculoskeletal: Normal range of motion. She exhibits no edema or tenderness.  Lymphadenopathy:    She has no cervical adenopathy.  Neurological: She is alert. Coordination normal.  Skin: Skin is warm and dry.  Multiple urticarial lesions scattered across the upper extremities, sub 2 cm in size. No petechiae or purpura  Psychiatric: She has a normal mood and affect. Her behavior is normal.  Nursing note and vitals reviewed.   ED Course  Procedures (including critical care time) Labs Review Labs Reviewed - No data to display  Imaging Review No results found.    MDM   Final diagnoses:  Allergic reaction, initial encounter  The patient has a stable gait, her vital signs are normal except for mild hypertension, she has no wheezing or difficulty breathing, no signs of oropharyngeal swelling, tongue swelling or change in phonation, at this time it appears she is having an allergic reaction and after an extensive and exhaustive list of potential exposures there is no obvious answer, the patient has likely had a reaction secondary to the food that she ate the other night. I have cautioned her against eating that food again and have encouraged follow-up with family doctor for allergy testing or referral. She will be given Benadryl, Pepcid, prednisone, observational period prior to discharge. The patient is in agreement.  Recheck - improved swelling and rash gone.  Pt will f/u with PMD < 3 days  Meds given in ED:  Medications  famotidine (PEPCID) tablet 20 mg (20 mg Oral Given 08/12/14 0825)  diphenhydrAMINE (BENADRYL) capsule 25 mg (25 mg Oral Given 08/12/14 0825)  predniSONE (DELTASONE) tablet 40 mg (40 mg Oral Given 08/12/14 0825)    New Prescriptions   DIPHENHYDRAMINE (BENADRYL) 25 MG TABLET    Take 1 tablet (25 mg total) by mouth every 6 (six) hours as needed for itching (Rash).   FAMOTIDINE (PEPCID) 20 MG  TABLET    Take 1 tablet (20 mg total) by mouth 2 (two) times daily.   PREDNISONE (DELTASONE) 20 MG TABLET    Take 2 tablets (40 mg total) by mouth daily.        Vida Roller, MD 08/12/14 1126

## 2014-08-12 NOTE — ED Notes (Signed)
Since yesterday, pt started having facial itching.  Has spots on body.  Pt woke up this morning with facial swelling.  No new medications.  No change in detergents or chemical exposure.

## 2014-08-31 ENCOUNTER — Other Ambulatory Visit: Payer: Self-pay

## 2015-07-14 DIAGNOSIS — E559 Vitamin D deficiency, unspecified: Secondary | ICD-10-CM | POA: Diagnosis not present

## 2015-07-14 DIAGNOSIS — E1122 Type 2 diabetes mellitus with diabetic chronic kidney disease: Secondary | ICD-10-CM | POA: Diagnosis not present

## 2015-07-14 DIAGNOSIS — I129 Hypertensive chronic kidney disease with stage 1 through stage 4 chronic kidney disease, or unspecified chronic kidney disease: Secondary | ICD-10-CM | POA: Diagnosis not present

## 2015-07-28 DIAGNOSIS — I129 Hypertensive chronic kidney disease with stage 1 through stage 4 chronic kidney disease, or unspecified chronic kidney disease: Secondary | ICD-10-CM | POA: Diagnosis not present

## 2015-07-28 DIAGNOSIS — E1122 Type 2 diabetes mellitus with diabetic chronic kidney disease: Secondary | ICD-10-CM | POA: Diagnosis not present

## 2015-07-28 DIAGNOSIS — E785 Hyperlipidemia, unspecified: Secondary | ICD-10-CM | POA: Diagnosis not present

## 2015-10-18 DIAGNOSIS — E559 Vitamin D deficiency, unspecified: Secondary | ICD-10-CM | POA: Diagnosis not present

## 2015-10-18 DIAGNOSIS — E1122 Type 2 diabetes mellitus with diabetic chronic kidney disease: Secondary | ICD-10-CM | POA: Diagnosis not present

## 2015-10-18 DIAGNOSIS — I129 Hypertensive chronic kidney disease with stage 1 through stage 4 chronic kidney disease, or unspecified chronic kidney disease: Secondary | ICD-10-CM | POA: Diagnosis not present

## 2015-12-22 DIAGNOSIS — E119 Type 2 diabetes mellitus without complications: Secondary | ICD-10-CM | POA: Diagnosis not present

## 2015-12-22 DIAGNOSIS — H04123 Dry eye syndrome of bilateral lacrimal glands: Secondary | ICD-10-CM | POA: Diagnosis not present

## 2015-12-22 DIAGNOSIS — Z961 Presence of intraocular lens: Secondary | ICD-10-CM | POA: Diagnosis not present

## 2016-01-03 DIAGNOSIS — N182 Chronic kidney disease, stage 2 (mild): Secondary | ICD-10-CM | POA: Diagnosis not present

## 2016-01-03 DIAGNOSIS — R319 Hematuria, unspecified: Secondary | ICD-10-CM | POA: Diagnosis not present

## 2016-01-03 DIAGNOSIS — E1122 Type 2 diabetes mellitus with diabetic chronic kidney disease: Secondary | ICD-10-CM | POA: Diagnosis not present

## 2016-01-10 DIAGNOSIS — R109 Unspecified abdominal pain: Secondary | ICD-10-CM | POA: Diagnosis not present

## 2016-01-10 DIAGNOSIS — R319 Hematuria, unspecified: Secondary | ICD-10-CM | POA: Diagnosis not present

## 2016-01-13 ENCOUNTER — Other Ambulatory Visit: Payer: Self-pay | Admitting: Internal Medicine

## 2016-01-13 DIAGNOSIS — R109 Unspecified abdominal pain: Secondary | ICD-10-CM

## 2016-01-20 ENCOUNTER — Ambulatory Visit
Admission: RE | Admit: 2016-01-20 | Discharge: 2016-01-20 | Disposition: A | Discharge status: Home / Self Care | Payer: Medicare Other | Source: Ambulatory Visit | Attending: Internal Medicine | Admitting: Internal Medicine

## 2016-01-20 DIAGNOSIS — R109 Unspecified abdominal pain: Secondary | ICD-10-CM

## 2016-01-20 DIAGNOSIS — R319 Hematuria, unspecified: Secondary | ICD-10-CM | POA: Diagnosis not present

## 2016-01-20 MED ORDER — IOPAMIDOL (ISOVUE-300) INJECTION 61%
125.0000 mL | Freq: Once | INTRAVENOUS | Status: AC | PRN
  Administered 2016-01-20: 125 mL via INTRAVENOUS

## 2016-01-24 DIAGNOSIS — E1122 Type 2 diabetes mellitus with diabetic chronic kidney disease: Secondary | ICD-10-CM | POA: Diagnosis not present

## 2016-01-24 DIAGNOSIS — I129 Hypertensive chronic kidney disease with stage 1 through stage 4 chronic kidney disease, or unspecified chronic kidney disease: Secondary | ICD-10-CM | POA: Diagnosis not present

## 2016-01-24 DIAGNOSIS — E559 Vitamin D deficiency, unspecified: Secondary | ICD-10-CM | POA: Diagnosis not present

## 2016-01-31 DIAGNOSIS — Z780 Asymptomatic menopausal state: Secondary | ICD-10-CM | POA: Diagnosis not present

## 2016-01-31 DIAGNOSIS — I129 Hypertensive chronic kidney disease with stage 1 through stage 4 chronic kidney disease, or unspecified chronic kidney disease: Secondary | ICD-10-CM | POA: Diagnosis not present

## 2016-01-31 DIAGNOSIS — E1122 Type 2 diabetes mellitus with diabetic chronic kidney disease: Secondary | ICD-10-CM | POA: Diagnosis not present

## 2016-01-31 DIAGNOSIS — Z13820 Encounter for screening for osteoporosis: Secondary | ICD-10-CM | POA: Diagnosis not present

## 2016-01-31 DIAGNOSIS — I700 Atherosclerosis of aorta: Secondary | ICD-10-CM | POA: Diagnosis not present

## 2016-02-15 DIAGNOSIS — Z9841 Cataract extraction status, right eye: Secondary | ICD-10-CM | POA: Diagnosis not present

## 2016-02-15 DIAGNOSIS — Z96653 Presence of artificial knee joint, bilateral: Secondary | ICD-10-CM | POA: Diagnosis not present

## 2016-02-15 DIAGNOSIS — Z9889 Other specified postprocedural states: Secondary | ICD-10-CM | POA: Diagnosis not present

## 2016-02-15 DIAGNOSIS — Z79899 Other long term (current) drug therapy: Secondary | ICD-10-CM | POA: Diagnosis not present

## 2016-02-15 DIAGNOSIS — M7731 Calcaneal spur, right foot: Secondary | ICD-10-CM | POA: Diagnosis not present

## 2016-02-15 DIAGNOSIS — E1136 Type 2 diabetes mellitus with diabetic cataract: Secondary | ICD-10-CM | POA: Diagnosis not present

## 2016-02-15 DIAGNOSIS — Z7984 Long term (current) use of oral hypoglycemic drugs: Secondary | ICD-10-CM | POA: Diagnosis not present

## 2016-02-15 DIAGNOSIS — M2011 Hallux valgus (acquired), right foot: Secondary | ICD-10-CM | POA: Diagnosis not present

## 2016-02-15 DIAGNOSIS — Z961 Presence of intraocular lens: Secondary | ICD-10-CM | POA: Diagnosis not present

## 2016-02-15 DIAGNOSIS — M79671 Pain in right foot: Secondary | ICD-10-CM | POA: Diagnosis not present

## 2016-02-15 DIAGNOSIS — Z981 Arthrodesis status: Secondary | ICD-10-CM | POA: Diagnosis not present

## 2016-02-15 DIAGNOSIS — I10 Essential (primary) hypertension: Secondary | ICD-10-CM | POA: Diagnosis not present

## 2016-02-15 DIAGNOSIS — Z882 Allergy status to sulfonamides status: Secondary | ICD-10-CM | POA: Diagnosis not present

## 2016-02-15 DIAGNOSIS — M19071 Primary osteoarthritis, right ankle and foot: Secondary | ICD-10-CM | POA: Diagnosis not present

## 2016-05-11 DIAGNOSIS — I129 Hypertensive chronic kidney disease with stage 1 through stage 4 chronic kidney disease, or unspecified chronic kidney disease: Secondary | ICD-10-CM | POA: Diagnosis not present

## 2016-05-11 DIAGNOSIS — E559 Vitamin D deficiency, unspecified: Secondary | ICD-10-CM | POA: Diagnosis not present

## 2016-05-11 DIAGNOSIS — E538 Deficiency of other specified B group vitamins: Secondary | ICD-10-CM | POA: Diagnosis not present

## 2016-05-11 DIAGNOSIS — E1122 Type 2 diabetes mellitus with diabetic chronic kidney disease: Secondary | ICD-10-CM | POA: Diagnosis not present

## 2016-05-17 DIAGNOSIS — E7800 Pure hypercholesterolemia, unspecified: Secondary | ICD-10-CM | POA: Diagnosis not present

## 2016-05-17 DIAGNOSIS — L821 Other seborrheic keratosis: Secondary | ICD-10-CM | POA: Diagnosis not present

## 2016-05-17 DIAGNOSIS — Z0000 Encounter for general adult medical examination without abnormal findings: Secondary | ICD-10-CM | POA: Diagnosis not present

## 2016-05-17 DIAGNOSIS — I10 Essential (primary) hypertension: Secondary | ICD-10-CM | POA: Diagnosis not present

## 2016-05-17 DIAGNOSIS — E119 Type 2 diabetes mellitus without complications: Secondary | ICD-10-CM | POA: Diagnosis not present

## 2016-08-10 DIAGNOSIS — E7800 Pure hypercholesterolemia, unspecified: Secondary | ICD-10-CM | POA: Diagnosis not present

## 2016-08-10 DIAGNOSIS — Z0000 Encounter for general adult medical examination without abnormal findings: Secondary | ICD-10-CM | POA: Diagnosis not present

## 2016-08-10 DIAGNOSIS — E119 Type 2 diabetes mellitus without complications: Secondary | ICD-10-CM | POA: Diagnosis not present

## 2016-08-22 DIAGNOSIS — I10 Essential (primary) hypertension: Secondary | ICD-10-CM | POA: Diagnosis not present

## 2016-08-22 DIAGNOSIS — E119 Type 2 diabetes mellitus without complications: Secondary | ICD-10-CM | POA: Diagnosis not present

## 2016-08-22 DIAGNOSIS — R7989 Other specified abnormal findings of blood chemistry: Secondary | ICD-10-CM | POA: Diagnosis not present

## 2016-08-22 DIAGNOSIS — E7800 Pure hypercholesterolemia, unspecified: Secondary | ICD-10-CM | POA: Diagnosis not present

## 2016-12-14 DIAGNOSIS — E119 Type 2 diabetes mellitus without complications: Secondary | ICD-10-CM | POA: Diagnosis not present

## 2016-12-14 DIAGNOSIS — E7800 Pure hypercholesterolemia, unspecified: Secondary | ICD-10-CM | POA: Diagnosis not present

## 2016-12-27 DIAGNOSIS — E119 Type 2 diabetes mellitus without complications: Secondary | ICD-10-CM | POA: Diagnosis not present

## 2016-12-27 DIAGNOSIS — E7800 Pure hypercholesterolemia, unspecified: Secondary | ICD-10-CM | POA: Diagnosis not present

## 2016-12-27 DIAGNOSIS — I10 Essential (primary) hypertension: Secondary | ICD-10-CM | POA: Diagnosis not present

## 2017-03-30 ENCOUNTER — Other Ambulatory Visit: Payer: Self-pay

## 2017-03-30 NOTE — Patient Outreach (Signed)
[facility] Care Management  03/30/2017  Yvonne Peterson [birth year] [id number]   Medication Adherence call to Yvonne Peterson spoke to patient because she is showing past due under [facility] [employer]on her Metformin ER 500 mg she said she still  has medication and wont need any at this time she is using her daughter which she went in to a rehab place  both are using the same medication she is on a low income and  was not going to throw out.patient said she will fill her prescription when she run out.    Lillia AbedAna Ollison-Moran CPhT Pharmacy Technician [facility] Care Management Direct Dial [phone]  Fax [phone] [email or web address]

## 2017-05-15 DIAGNOSIS — Z0000 Encounter for general adult medical examination without abnormal findings: Secondary | ICD-10-CM | POA: Diagnosis not present

## 2017-05-15 DIAGNOSIS — E1122 Type 2 diabetes mellitus with diabetic chronic kidney disease: Secondary | ICD-10-CM | POA: Diagnosis not present

## 2017-05-15 DIAGNOSIS — E785 Hyperlipidemia, unspecified: Secondary | ICD-10-CM | POA: Diagnosis not present

## 2017-05-15 DIAGNOSIS — E559 Vitamin D deficiency, unspecified: Secondary | ICD-10-CM | POA: Diagnosis not present

## 2017-10-12 DIAGNOSIS — M25552 Pain in left hip: Secondary | ICD-10-CM | POA: Diagnosis not present

## 2017-10-12 DIAGNOSIS — M1612 Unilateral primary osteoarthritis, left hip: Secondary | ICD-10-CM | POA: Diagnosis not present

## 2017-10-12 DIAGNOSIS — M47818 Spondylosis without myelopathy or radiculopathy, sacral and sacrococcygeal region: Secondary | ICD-10-CM | POA: Diagnosis not present

## 2017-10-12 DIAGNOSIS — M160 Bilateral primary osteoarthritis of hip: Secondary | ICD-10-CM | POA: Diagnosis not present

## 2017-10-15 DIAGNOSIS — M1612 Unilateral primary osteoarthritis, left hip: Secondary | ICD-10-CM | POA: Diagnosis not present

## 2017-10-15 DIAGNOSIS — Z01810 Encounter for preprocedural cardiovascular examination: Secondary | ICD-10-CM | POA: Diagnosis not present

## 2017-10-19 DIAGNOSIS — E0800 Diabetes mellitus due to underlying condition with hyperosmolarity without nonketotic hyperglycemic-hyperosmolar coma (NKHHC): Secondary | ICD-10-CM | POA: Diagnosis not present

## 2017-10-25 DIAGNOSIS — Z7409 Other reduced mobility: Secondary | ICD-10-CM | POA: Diagnosis not present

## 2017-10-25 DIAGNOSIS — M25652 Stiffness of left hip, not elsewhere classified: Secondary | ICD-10-CM | POA: Diagnosis not present

## 2017-10-25 DIAGNOSIS — M1612 Unilateral primary osteoarthritis, left hip: Secondary | ICD-10-CM | POA: Diagnosis not present

## 2017-11-01 DIAGNOSIS — N183 Chronic kidney disease, stage 3 (moderate): Secondary | ICD-10-CM | POA: Diagnosis not present

## 2017-11-01 DIAGNOSIS — Z96642 Presence of left artificial hip joint: Secondary | ICD-10-CM | POA: Diagnosis not present

## 2017-11-01 DIAGNOSIS — G8918 Other acute postprocedural pain: Secondary | ICD-10-CM | POA: Diagnosis not present

## 2017-11-01 DIAGNOSIS — G4733 Obstructive sleep apnea (adult) (pediatric): Secondary | ICD-10-CM | POA: Diagnosis not present

## 2017-11-01 DIAGNOSIS — E1122 Type 2 diabetes mellitus with diabetic chronic kidney disease: Secondary | ICD-10-CM | POA: Diagnosis not present

## 2017-11-01 DIAGNOSIS — G8929 Other chronic pain: Secondary | ICD-10-CM | POA: Diagnosis not present

## 2017-11-01 DIAGNOSIS — M1612 Unilateral primary osteoarthritis, left hip: Secondary | ICD-10-CM | POA: Diagnosis not present

## 2017-11-01 DIAGNOSIS — N189 Chronic kidney disease, unspecified: Secondary | ICD-10-CM | POA: Diagnosis not present

## 2017-11-01 DIAGNOSIS — K219 Gastro-esophageal reflux disease without esophagitis: Secondary | ICD-10-CM | POA: Diagnosis not present

## 2017-11-01 DIAGNOSIS — I129 Hypertensive chronic kidney disease with stage 1 through stage 4 chronic kidney disease, or unspecified chronic kidney disease: Secondary | ICD-10-CM | POA: Diagnosis not present

## 2017-11-01 DIAGNOSIS — Z471 Aftercare following joint replacement surgery: Secondary | ICD-10-CM | POA: Diagnosis not present

## 2017-11-07 DIAGNOSIS — Z7409 Other reduced mobility: Secondary | ICD-10-CM | POA: Diagnosis not present

## 2017-11-07 DIAGNOSIS — M1612 Unilateral primary osteoarthritis, left hip: Secondary | ICD-10-CM | POA: Diagnosis not present

## 2017-11-07 DIAGNOSIS — M25652 Stiffness of left hip, not elsewhere classified: Secondary | ICD-10-CM | POA: Diagnosis not present

## 2017-11-12 DIAGNOSIS — E785 Hyperlipidemia, unspecified: Secondary | ICD-10-CM | POA: Diagnosis not present

## 2017-11-12 DIAGNOSIS — I10 Essential (primary) hypertension: Secondary | ICD-10-CM | POA: Diagnosis not present

## 2017-11-12 DIAGNOSIS — E1122 Type 2 diabetes mellitus with diabetic chronic kidney disease: Secondary | ICD-10-CM | POA: Diagnosis not present

## 2017-11-14 DIAGNOSIS — M25652 Stiffness of left hip, not elsewhere classified: Secondary | ICD-10-CM | POA: Diagnosis not present

## 2017-11-14 DIAGNOSIS — Z7409 Other reduced mobility: Secondary | ICD-10-CM | POA: Diagnosis not present

## 2017-11-14 DIAGNOSIS — M1612 Unilateral primary osteoarthritis, left hip: Secondary | ICD-10-CM | POA: Diagnosis not present

## 2017-11-14 IMAGING — CT CT ABD-PELV W/ CM
3 of 5 series · 10 of 36 positions shown, 16 images · IV contrast (READICAT/WATER & [ID] ISOVUE 300)
Comparison: None.

CLINICAL DATA: Left-sided flank pain. Hematuria. History of prior
appendectomy ankle cholecystectomy, bladder suspension, ovarian cyst
removal and hemorrhoidectomy.

EXAM:
CT ABDOMEN AND PELVIS WITH CONTRAST
TECHNIQUE: Multidetector CT imaging of the abdomen and pelvis was performed
using the standard protocol following bolus administration of
intravenous contrast.
CONTRAST:  125mL [id number] IOPAMIDOL ([id number]) INJECTION 61%
Creatinine was obtained on site at [HOSPITAL] at [REDACTED].
Results: Creatinine 1.1 mg/dL.

[Series 3: abd/pelvis with · axial · 0.90mm/px · z∈[-383,-53]mm · 7 of 89 slices shown, 12 images]
[im 12/89  soft-tissue]
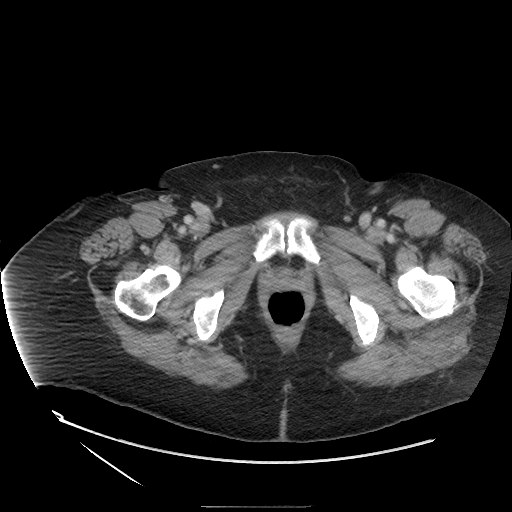
[im 12/89  bone]
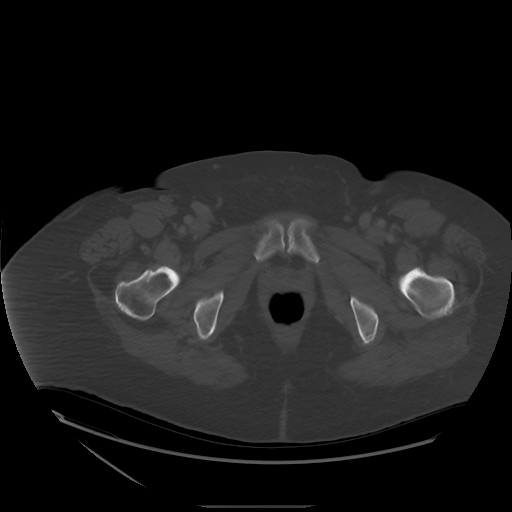
[im 23/89  soft-tissue]
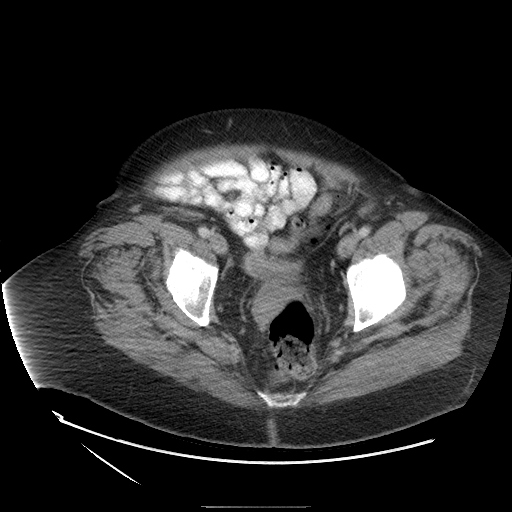
[im 34/89  soft-tissue]
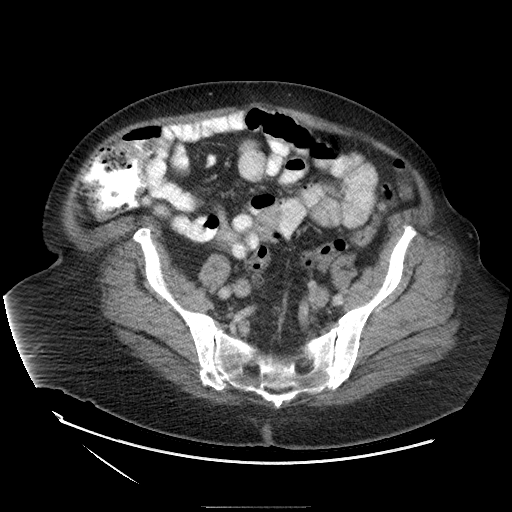
[im 45/89  soft-tissue]
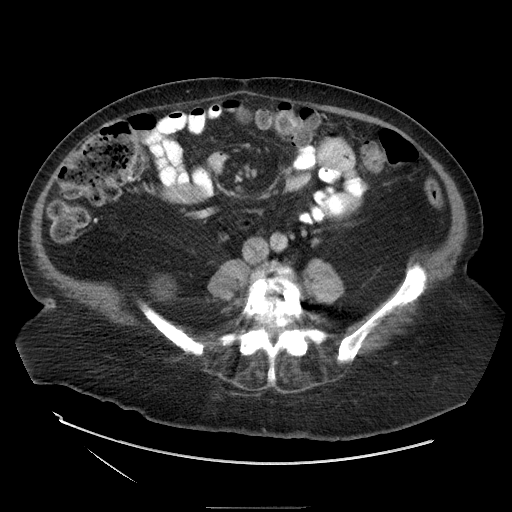
[im 45/89  lung]
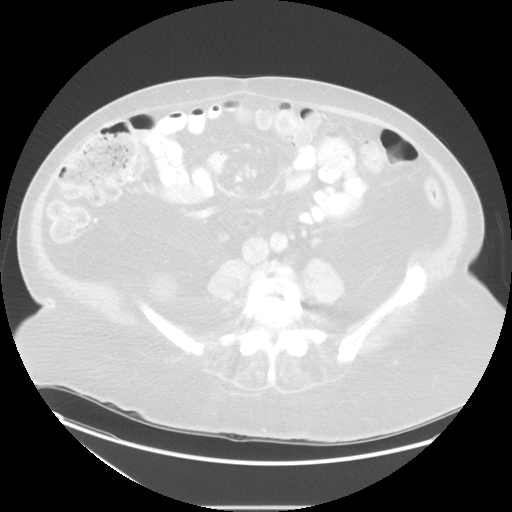
[im 56/89  soft-tissue]
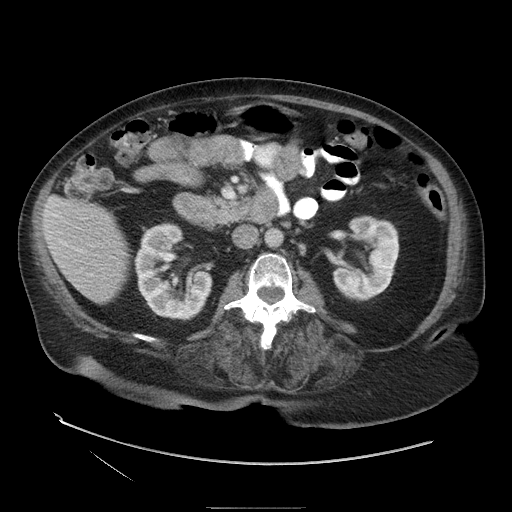
[im 56/89  lung]
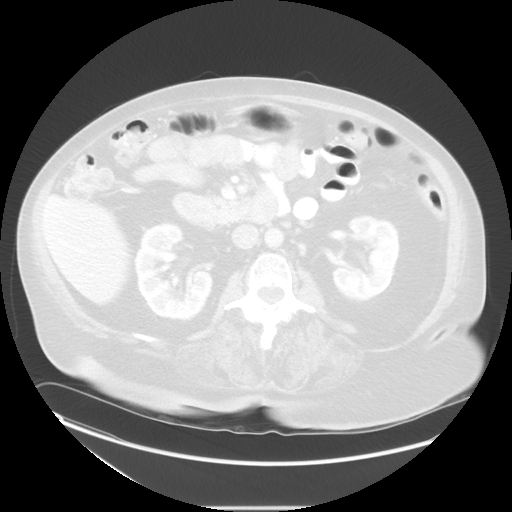
[im 67/89  soft-tissue]
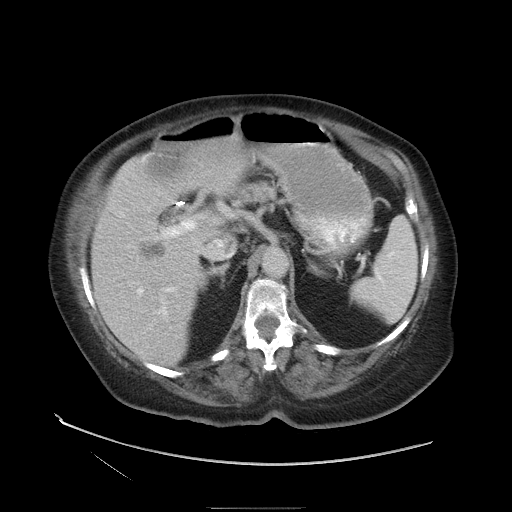
[im 67/89  lung]
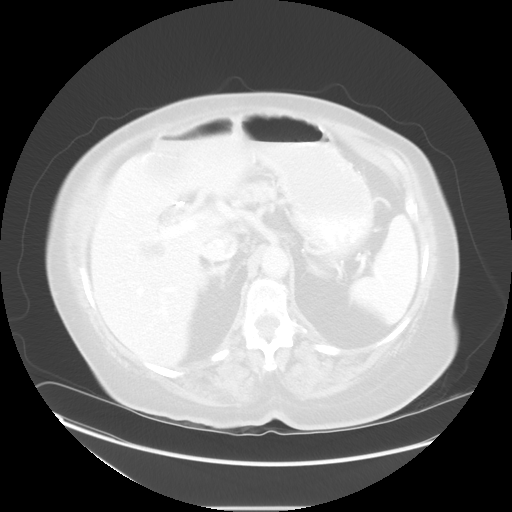
[im 78/89  soft-tissue]
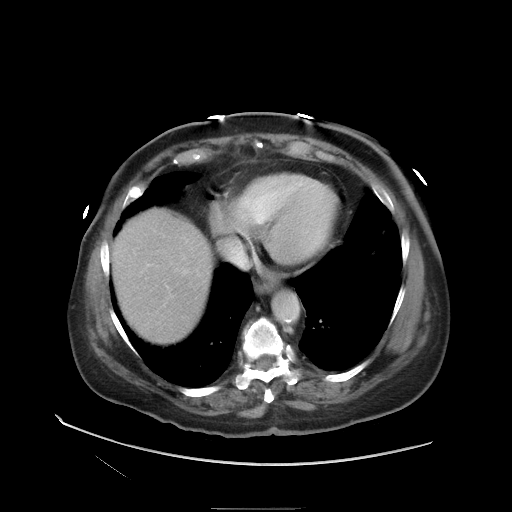
[im 78/89  lung]
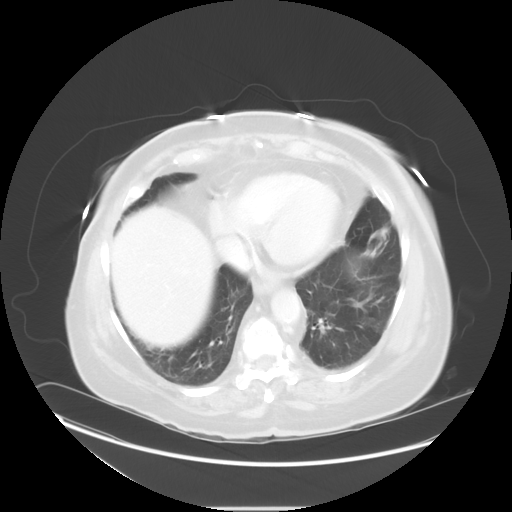

[Series 601: coronal body · coronal · 0.90mm/px · 1 of 137 slices shown, 2 images]
[im 46/137  soft-tissue]
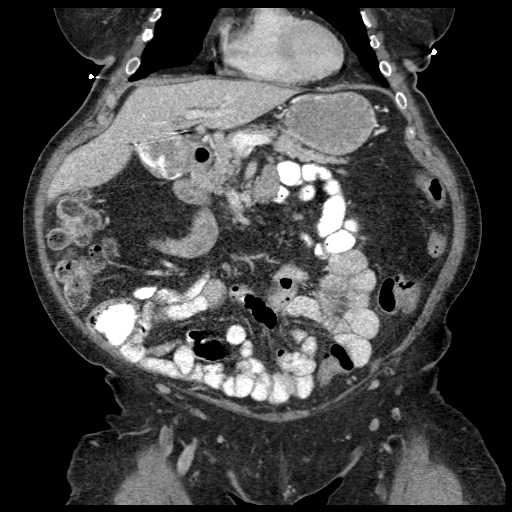
[im 46/137  bone]
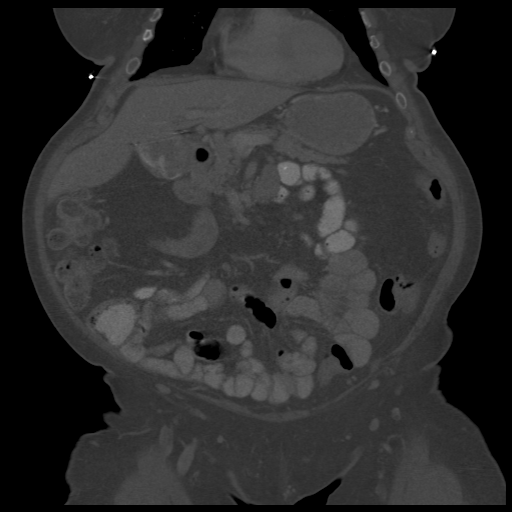

[Series 602: sagittal body · sagittal · 0.90mm/px · 2 of 185 slices shown]
[im 22/185  soft-tissue]
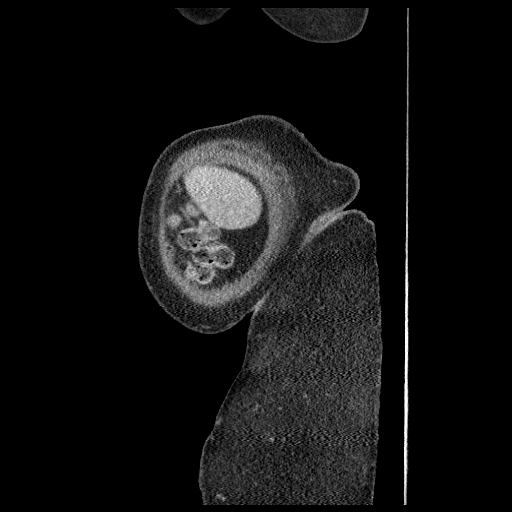
[im 44/185  soft-tissue]
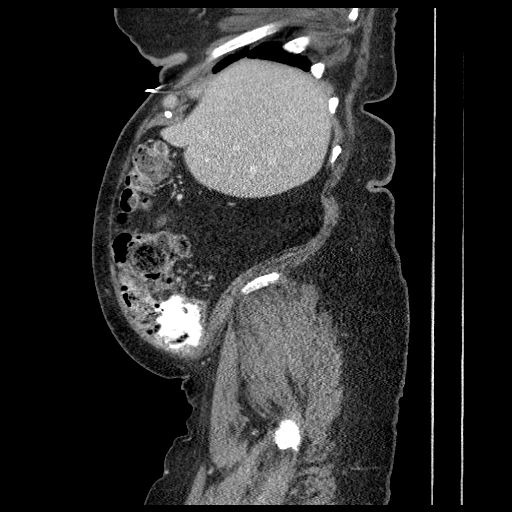

[10 of 36 positions shown; findings below may reference images not displayed]

FINDINGS: Lower chest: Limited visualization of the lower thorax demonstrates
a punctate (approximately 2 mm) pulmonary nodule within the imaged
left upper lobe (image 2, series 4). Minimal ill-defined reticular
opacities about the perihilar aspect of the lingula and subpleural
aspect of the image right upper lobe. Minimal dependent ground-glass
atelectasis within the medial basilar aspect of the right lower
lobe. No pleural effusion. Normal heart size. No pericardial
effusion.

Hepatobiliary: Normal hepatic contour. Note is made of a
approximately 3.0 x 2.2 cm hypo attenuating (19 Hounsfield unit)
cyst within the anterior segment of the right lobe of the liver (22,
series 3). Additional punctate hypo attenuating hepatic lesions are
too small to adequately characterize of favored to represent
additional hepatic cysts. Post cholecystectomy. There is very mild
centralized intrahepatic bili duct dilatation, likely the sequela of
post cholecystectomy state. Normal caliber the common bile duct. No
ascites.

Pancreas: Note is made of a punctate (approximately 1.1 cm) hypo
attenuating lesion within the tail of the pancreas (axial image 28,
series 3, coronal image 50, series 601). No definitive pancreatic
duct dilatation. No pancreatic atrophy or peripancreatic stranding.

Spleen: Normal appearance of the spleen

Adrenals/Urinary Tract: There is symmetric enhancement and excretion
of the bilateral kidneys. Note is made of an approximately 4.3 cm
hypo attenuating (14 Hounsfield unit) partially exophytic cyst
arises from the inferior pole the right kidney as well as a punctate
(approximately 1.2 cm hypo attenuating (9 Hounsfield unit) partially
exophytic cyst arising from the superior pole the left kidney.
Additional punctate (approximately 0.9 cm) hypo attenuating
left-sided renal lesion is too small to adequately characterize of
favored to represent additional renal cysts. No definite renal
stones this postcontrast examination. Note is made of a slightly
exaggerated horizontal lie of the right kidney. No urine obstruction
or perinephric stranding. Normal appearance of the bilateral adrenal
glands. Normal appearance of the urinary bladder given
underdistention.

Stomach/Bowel: Ingested enteric contrast extends to the level of the
cecum. Scattered colonic diverticulosis without evidence of
diverticulitis. The bowel is otherwise normal in course and caliber
without wall thickening or evidence of enteric obstruction. Normal
appearance of the terminal ileum. The appendix is not visualized
compatible with provided surgical history. No pneumoperitoneum,
pneumatosis or portal venous gas.

Vascular/Lymphatic: Scattered atherosclerotic plaque with a normal
caliber abdominal aorta. The major branch vessels of the abdominal
aorta appear patent on this non CTA examination.

No bulky retroperitoneal, mesenteric, pelvic or inguinal
lymphadenopathy.

Reproductive: Normal appearance of the pelvic organs. No discrete
adnexal lesion. No free fluid in the pelvic cul-de-sac.

Other: Regional soft tissues appear normal.

Musculoskeletal: Post L4-L5 paraspinal fusion. No evidence of
hardware failure loosening. Suspected postoperative change / bone
graft harvesting site involving the posterior aspect the right
ilium. Mild-to-moderate multilevel lumbar spine DDD, worse at L3-L4
and L5-S1 with disc space height loss, endplate irregularity and
small posteriorly directed disc osteophyte complexes at these
locations. Mild degenerative change of the left hip with joint space
loss, subchondral sclerosis and osteophytosis.
IMPRESSION: 1. No explanation for patient's left-sided flank pain. Specifically,
no evidence of nephrolithiasis (on this postcontrast examination),
urinary or enteric obstruction.
2. Colonic diverticulosis without evidence of diverticulitis.
3.  Aortic Atherosclerosis ([id number]-170.0)
4. **An incidental finding of potential clinical significance has
been found. Note is made of an approximately 1.1 cm hypo attenuating
cystic lesion within the tail of the pancreas indeterminate though
statistically of benign etiology potentially a pseudocyst or
incidental side-branch IPMN or serous cystadenoma. A follow-up MRI
in 1 year is recommended to ensure stability. This recommendation
follows ACR consensus guidelines: Managing Incidental Findings on
Abdominal CT: White Paper of the ACR Incidental Findings Committee.
[HOSPITAL] 3070;[DATE]**

## 2017-11-16 DIAGNOSIS — Z0000 Encounter for general adult medical examination without abnormal findings: Secondary | ICD-10-CM | POA: Diagnosis not present

## 2017-11-16 DIAGNOSIS — E119 Type 2 diabetes mellitus without complications: Secondary | ICD-10-CM | POA: Diagnosis not present

## 2017-11-16 DIAGNOSIS — D649 Anemia, unspecified: Secondary | ICD-10-CM | POA: Diagnosis not present

## 2017-11-19 DIAGNOSIS — Z96642 Presence of left artificial hip joint: Secondary | ICD-10-CM | POA: Diagnosis not present

## 2017-11-19 DIAGNOSIS — Z471 Aftercare following joint replacement surgery: Secondary | ICD-10-CM | POA: Diagnosis not present

## 2017-12-17 DIAGNOSIS — Z96642 Presence of left artificial hip joint: Secondary | ICD-10-CM | POA: Diagnosis not present

## 2017-12-17 DIAGNOSIS — Z471 Aftercare following joint replacement surgery: Secondary | ICD-10-CM | POA: Diagnosis not present

## 2018-02-12 DIAGNOSIS — D649 Anemia, unspecified: Secondary | ICD-10-CM | POA: Diagnosis not present

## 2018-04-10 DIAGNOSIS — Z471 Aftercare following joint replacement surgery: Secondary | ICD-10-CM | POA: Diagnosis not present

## 2018-04-10 DIAGNOSIS — M533 Sacrococcygeal disorders, not elsewhere classified: Secondary | ICD-10-CM | POA: Diagnosis not present

## 2018-04-10 DIAGNOSIS — M1611 Unilateral primary osteoarthritis, right hip: Secondary | ICD-10-CM | POA: Diagnosis not present

## 2018-04-10 DIAGNOSIS — Z96642 Presence of left artificial hip joint: Secondary | ICD-10-CM | POA: Diagnosis not present

## 2018-05-15 DIAGNOSIS — Z961 Presence of intraocular lens: Secondary | ICD-10-CM | POA: Diagnosis not present

## 2018-05-15 DIAGNOSIS — H04123 Dry eye syndrome of bilateral lacrimal glands: Secondary | ICD-10-CM | POA: Diagnosis not present

## 2018-07-02 DIAGNOSIS — G5602 Carpal tunnel syndrome, left upper limb: Secondary | ICD-10-CM | POA: Diagnosis not present

## 2018-07-07 DIAGNOSIS — N390 Urinary tract infection, site not specified: Secondary | ICD-10-CM | POA: Diagnosis not present

## 2018-07-10 DIAGNOSIS — E119 Type 2 diabetes mellitus without complications: Secondary | ICD-10-CM | POA: Diagnosis not present

## 2018-07-10 DIAGNOSIS — G5602 Carpal tunnel syndrome, left upper limb: Secondary | ICD-10-CM | POA: Diagnosis not present

## 2018-07-10 DIAGNOSIS — Z7984 Long term (current) use of oral hypoglycemic drugs: Secondary | ICD-10-CM | POA: Diagnosis not present

## 2018-07-19 DIAGNOSIS — M8580 Other specified disorders of bone density and structure, unspecified site: Secondary | ICD-10-CM | POA: Diagnosis not present

## 2018-07-19 DIAGNOSIS — M1611 Unilateral primary osteoarthritis, right hip: Secondary | ICD-10-CM | POA: Diagnosis not present

## 2018-07-19 DIAGNOSIS — M533 Sacrococcygeal disorders, not elsewhere classified: Secondary | ICD-10-CM | POA: Diagnosis not present

## 2018-07-19 DIAGNOSIS — Z96642 Presence of left artificial hip joint: Secondary | ICD-10-CM | POA: Diagnosis not present

## 2018-07-19 DIAGNOSIS — Z96641 Presence of right artificial hip joint: Secondary | ICD-10-CM | POA: Diagnosis not present

## 2018-07-19 DIAGNOSIS — M25551 Pain in right hip: Secondary | ICD-10-CM | POA: Diagnosis not present

## 2018-07-25 DIAGNOSIS — Z4802 Encounter for removal of sutures: Secondary | ICD-10-CM | POA: Diagnosis not present

## 2018-07-31 DIAGNOSIS — M25551 Pain in right hip: Secondary | ICD-10-CM | POA: Diagnosis not present

## 2018-07-31 DIAGNOSIS — Z7952 Long term (current) use of systemic steroids: Secondary | ICD-10-CM | POA: Diagnosis not present

## 2018-08-22 DIAGNOSIS — R400 Somnolence: Secondary | ICD-10-CM | POA: Diagnosis not present

## 2018-08-22 DIAGNOSIS — G4733 Obstructive sleep apnea (adult) (pediatric): Secondary | ICD-10-CM | POA: Diagnosis not present

## 2018-08-28 DIAGNOSIS — R400 Somnolence: Secondary | ICD-10-CM | POA: Diagnosis not present

## 2018-09-07 DIAGNOSIS — Z96642 Presence of left artificial hip joint: Secondary | ICD-10-CM | POA: Diagnosis not present

## 2018-09-07 DIAGNOSIS — Z471 Aftercare following joint replacement surgery: Secondary | ICD-10-CM | POA: Diagnosis not present

## 2018-09-07 DIAGNOSIS — M25552 Pain in left hip: Secondary | ICD-10-CM | POA: Diagnosis not present

## 2018-09-07 DIAGNOSIS — Z96649 Presence of unspecified artificial hip joint: Secondary | ICD-10-CM | POA: Diagnosis not present

## 2019-01-21 DIAGNOSIS — I10 Essential (primary) hypertension: Secondary | ICD-10-CM | POA: Diagnosis not present

## 2019-01-21 DIAGNOSIS — R739 Hyperglycemia, unspecified: Secondary | ICD-10-CM | POA: Diagnosis not present

## 2019-01-21 DIAGNOSIS — N390 Urinary tract infection, site not specified: Secondary | ICD-10-CM | POA: Diagnosis not present

## 2019-01-21 DIAGNOSIS — E785 Hyperlipidemia, unspecified: Secondary | ICD-10-CM | POA: Diagnosis not present

## 2019-01-21 DIAGNOSIS — E039 Hypothyroidism, unspecified: Secondary | ICD-10-CM | POA: Diagnosis not present

## 2019-01-21 DIAGNOSIS — E559 Vitamin D deficiency, unspecified: Secondary | ICD-10-CM | POA: Diagnosis not present

## 2019-01-27 DIAGNOSIS — E785 Hyperlipidemia, unspecified: Secondary | ICD-10-CM | POA: Diagnosis not present

## 2019-01-27 DIAGNOSIS — E559 Vitamin D deficiency, unspecified: Secondary | ICD-10-CM | POA: Diagnosis not present

## 2019-01-27 DIAGNOSIS — E119 Type 2 diabetes mellitus without complications: Secondary | ICD-10-CM | POA: Diagnosis not present

## 2019-01-27 DIAGNOSIS — Z0000 Encounter for general adult medical examination without abnormal findings: Secondary | ICD-10-CM | POA: Diagnosis not present

## 2019-08-01 DIAGNOSIS — E119 Type 2 diabetes mellitus without complications: Secondary | ICD-10-CM | POA: Diagnosis not present

## 2019-08-01 DIAGNOSIS — E559 Vitamin D deficiency, unspecified: Secondary | ICD-10-CM | POA: Diagnosis not present

## 2019-08-01 DIAGNOSIS — I129 Hypertensive chronic kidney disease with stage 1 through stage 4 chronic kidney disease, or unspecified chronic kidney disease: Secondary | ICD-10-CM | POA: Diagnosis not present

## 2019-08-05 DIAGNOSIS — I129 Hypertensive chronic kidney disease with stage 1 through stage 4 chronic kidney disease, or unspecified chronic kidney disease: Secondary | ICD-10-CM | POA: Diagnosis not present

## 2019-08-06 DIAGNOSIS — I700 Atherosclerosis of aorta: Secondary | ICD-10-CM | POA: Diagnosis not present

## 2019-08-06 DIAGNOSIS — E1122 Type 2 diabetes mellitus with diabetic chronic kidney disease: Secondary | ICD-10-CM | POA: Diagnosis not present

## 2019-08-06 DIAGNOSIS — I129 Hypertensive chronic kidney disease with stage 1 through stage 4 chronic kidney disease, or unspecified chronic kidney disease: Secondary | ICD-10-CM | POA: Diagnosis not present

## 2020-02-09 DIAGNOSIS — Z96642 Presence of left artificial hip joint: Secondary | ICD-10-CM | POA: Diagnosis not present

## 2020-02-09 DIAGNOSIS — M1611 Unilateral primary osteoarthritis, right hip: Secondary | ICD-10-CM | POA: Diagnosis not present

## 2020-02-09 DIAGNOSIS — M461 Sacroiliitis, not elsewhere classified: Secondary | ICD-10-CM | POA: Diagnosis not present

## 2020-02-09 DIAGNOSIS — M85851 Other specified disorders of bone density and structure, right thigh: Secondary | ICD-10-CM | POA: Diagnosis not present

## 2020-02-09 DIAGNOSIS — M25551 Pain in right hip: Secondary | ICD-10-CM | POA: Diagnosis not present

## 2020-04-15 DIAGNOSIS — N1832 Chronic kidney disease, stage 3b: Secondary | ICD-10-CM | POA: Diagnosis not present

## 2020-04-15 DIAGNOSIS — N390 Urinary tract infection, site not specified: Secondary | ICD-10-CM | POA: Diagnosis not present

## 2020-04-15 DIAGNOSIS — I129 Hypertensive chronic kidney disease with stage 1 through stage 4 chronic kidney disease, or unspecified chronic kidney disease: Secondary | ICD-10-CM | POA: Diagnosis not present

## 2020-04-29 DIAGNOSIS — M25551 Pain in right hip: Secondary | ICD-10-CM | POA: Diagnosis not present

## 2020-04-29 DIAGNOSIS — M1611 Unilateral primary osteoarthritis, right hip: Secondary | ICD-10-CM | POA: Diagnosis not present

## 2020-07-12 DIAGNOSIS — I129 Hypertensive chronic kidney disease with stage 1 through stage 4 chronic kidney disease, or unspecified chronic kidney disease: Secondary | ICD-10-CM | POA: Diagnosis not present

## 2020-07-12 DIAGNOSIS — E119 Type 2 diabetes mellitus without complications: Secondary | ICD-10-CM | POA: Diagnosis not present

## 2020-07-12 DIAGNOSIS — Z01818 Encounter for other preprocedural examination: Secondary | ICD-10-CM | POA: Diagnosis not present

## 2020-07-12 DIAGNOSIS — R7989 Other specified abnormal findings of blood chemistry: Secondary | ICD-10-CM | POA: Diagnosis not present

## 2020-07-12 DIAGNOSIS — N1832 Chronic kidney disease, stage 3b: Secondary | ICD-10-CM | POA: Diagnosis not present

## 2020-07-12 DIAGNOSIS — M1611 Unilateral primary osteoarthritis, right hip: Secondary | ICD-10-CM | POA: Diagnosis not present

## 2020-07-21 DIAGNOSIS — M1611 Unilateral primary osteoarthritis, right hip: Secondary | ICD-10-CM | POA: Diagnosis not present

## 2020-07-21 DIAGNOSIS — Z96641 Presence of right artificial hip joint: Secondary | ICD-10-CM | POA: Diagnosis not present

## 2020-07-21 DIAGNOSIS — Z471 Aftercare following joint replacement surgery: Secondary | ICD-10-CM | POA: Diagnosis not present

## 2020-07-21 DIAGNOSIS — Z7409 Other reduced mobility: Secondary | ICD-10-CM | POA: Diagnosis not present

## 2020-07-22 DIAGNOSIS — Z20822 Contact with and (suspected) exposure to covid-19: Secondary | ICD-10-CM | POA: Diagnosis not present

## 2020-07-22 DIAGNOSIS — M1611 Unilateral primary osteoarthritis, right hip: Secondary | ICD-10-CM | POA: Diagnosis not present

## 2020-07-22 DIAGNOSIS — Z01812 Encounter for preprocedural laboratory examination: Secondary | ICD-10-CM | POA: Diagnosis not present

## 2020-07-29 DIAGNOSIS — Z96642 Presence of left artificial hip joint: Secondary | ICD-10-CM | POA: Diagnosis not present

## 2020-07-29 DIAGNOSIS — E1122 Type 2 diabetes mellitus with diabetic chronic kidney disease: Secondary | ICD-10-CM | POA: Diagnosis not present

## 2020-07-29 DIAGNOSIS — Z7409 Other reduced mobility: Secondary | ICD-10-CM | POA: Diagnosis not present

## 2020-07-29 DIAGNOSIS — N189 Chronic kidney disease, unspecified: Secondary | ICD-10-CM | POA: Diagnosis not present

## 2020-07-29 DIAGNOSIS — Z471 Aftercare following joint replacement surgery: Secondary | ICD-10-CM | POA: Diagnosis not present

## 2020-07-29 DIAGNOSIS — Z79899 Other long term (current) drug therapy: Secondary | ICD-10-CM | POA: Diagnosis not present

## 2020-07-29 DIAGNOSIS — G4733 Obstructive sleep apnea (adult) (pediatric): Secondary | ICD-10-CM | POA: Diagnosis not present

## 2020-07-29 DIAGNOSIS — M1611 Unilateral primary osteoarthritis, right hip: Secondary | ICD-10-CM | POA: Diagnosis not present

## 2020-07-29 DIAGNOSIS — I129 Hypertensive chronic kidney disease with stage 1 through stage 4 chronic kidney disease, or unspecified chronic kidney disease: Secondary | ICD-10-CM | POA: Diagnosis not present

## 2020-07-29 DIAGNOSIS — Z96641 Presence of right artificial hip joint: Secondary | ICD-10-CM | POA: Diagnosis not present

## 2020-08-09 DIAGNOSIS — Z471 Aftercare following joint replacement surgery: Secondary | ICD-10-CM | POA: Diagnosis not present

## 2020-08-09 DIAGNOSIS — M8588 Other specified disorders of bone density and structure, other site: Secondary | ICD-10-CM | POA: Diagnosis not present

## 2020-08-09 DIAGNOSIS — Z96641 Presence of right artificial hip joint: Secondary | ICD-10-CM | POA: Diagnosis not present

## 2020-08-09 DIAGNOSIS — M1611 Unilateral primary osteoarthritis, right hip: Secondary | ICD-10-CM | POA: Diagnosis not present

## 2020-09-13 DIAGNOSIS — S72114A Nondisplaced fracture of greater trochanter of right femur, initial encounter for closed fracture: Secondary | ICD-10-CM | POA: Diagnosis not present

## 2020-09-13 DIAGNOSIS — M9701XA Periprosthetic fracture around internal prosthetic right hip joint, initial encounter: Secondary | ICD-10-CM | POA: Diagnosis not present

## 2020-09-13 DIAGNOSIS — Z96643 Presence of artificial hip joint, bilateral: Secondary | ICD-10-CM | POA: Diagnosis not present

## 2020-09-13 DIAGNOSIS — M461 Sacroiliitis, not elsewhere classified: Secondary | ICD-10-CM | POA: Diagnosis not present

## 2020-09-13 DIAGNOSIS — M978XXA Periprosthetic fracture around other internal prosthetic joint, initial encounter: Secondary | ICD-10-CM | POA: Diagnosis not present

## 2020-09-13 DIAGNOSIS — Z471 Aftercare following joint replacement surgery: Secondary | ICD-10-CM | POA: Diagnosis not present

## 2020-09-13 DIAGNOSIS — Z96641 Presence of right artificial hip joint: Secondary | ICD-10-CM | POA: Diagnosis not present

## 2020-09-13 DIAGNOSIS — K649 Unspecified hemorrhoids: Secondary | ICD-10-CM | POA: Diagnosis not present

## 2020-09-13 DIAGNOSIS — M949 Disorder of cartilage, unspecified: Secondary | ICD-10-CM | POA: Diagnosis not present

## 2020-09-15 DIAGNOSIS — M25511 Pain in right shoulder: Secondary | ICD-10-CM | POA: Diagnosis not present

## 2020-09-15 DIAGNOSIS — M19011 Primary osteoarthritis, right shoulder: Secondary | ICD-10-CM | POA: Diagnosis not present

## 2021-06-08 DIAGNOSIS — R197 Diarrhea, unspecified: Secondary | ICD-10-CM | POA: Diagnosis not present

## 2021-06-08 DIAGNOSIS — E1122 Type 2 diabetes mellitus with diabetic chronic kidney disease: Secondary | ICD-10-CM | POA: Diagnosis not present

## 2021-06-08 DIAGNOSIS — N1832 Chronic kidney disease, stage 3b: Secondary | ICD-10-CM | POA: Diagnosis not present

## 2021-06-08 DIAGNOSIS — I10 Essential (primary) hypertension: Secondary | ICD-10-CM | POA: Diagnosis not present

## 2021-06-08 DIAGNOSIS — R109 Unspecified abdominal pain: Secondary | ICD-10-CM | POA: Diagnosis not present

## 2021-06-08 DIAGNOSIS — K529 Noninfective gastroenteritis and colitis, unspecified: Secondary | ICD-10-CM | POA: Diagnosis not present

## 2021-06-09 ENCOUNTER — Other Ambulatory Visit: Payer: Self-pay | Admitting: Internal Medicine

## 2021-06-09 DIAGNOSIS — E1122 Type 2 diabetes mellitus with diabetic chronic kidney disease: Secondary | ICD-10-CM

## 2021-07-12 ENCOUNTER — Other Ambulatory Visit: Payer: Medicare Other

## 2021-08-05 ENCOUNTER — Other Ambulatory Visit: Payer: Medicare Other

## 2021-08-30 ENCOUNTER — Other Ambulatory Visit: Payer: Medicare Other

## 2021-10-20 ENCOUNTER — Other Ambulatory Visit: Payer: Self-pay | Admitting: Internal Medicine

## 2021-10-20 DIAGNOSIS — K625 Hemorrhage of anus and rectum: Secondary | ICD-10-CM

## 2022-01-22 ENCOUNTER — Ambulatory Visit
Admission: RE | Admit: 2022-01-22 | Discharge: 2022-01-22 | Disposition: A | Discharge status: Home / Self Care | Payer: Medicare Other | Source: Ambulatory Visit | Attending: Internal Medicine | Admitting: Internal Medicine

## 2022-01-22 DIAGNOSIS — K862 Cyst of pancreas: Secondary | ICD-10-CM | POA: Diagnosis not present

## 2022-01-22 DIAGNOSIS — Z981 Arthrodesis status: Secondary | ICD-10-CM | POA: Diagnosis not present

## 2022-01-22 DIAGNOSIS — N281 Cyst of kidney, acquired: Secondary | ICD-10-CM | POA: Diagnosis not present

## 2022-01-22 DIAGNOSIS — K625 Hemorrhage of anus and rectum: Secondary | ICD-10-CM

## 2022-01-22 DIAGNOSIS — K7689 Other specified diseases of liver: Secondary | ICD-10-CM | POA: Diagnosis not present

## 2022-02-01 DIAGNOSIS — K625 Hemorrhage of anus and rectum: Secondary | ICD-10-CM | POA: Diagnosis not present

## 2022-02-01 DIAGNOSIS — I10 Essential (primary) hypertension: Secondary | ICD-10-CM | POA: Diagnosis not present

## 2022-02-01 DIAGNOSIS — K649 Unspecified hemorrhoids: Secondary | ICD-10-CM | POA: Diagnosis not present

## 2022-03-30 ENCOUNTER — Telehealth: Payer: Self-pay | Admitting: Podiatry

## 2022-03-30 NOTE — Telephone Encounter (Signed)
ERROR

## 2022-10-19 DIAGNOSIS — E1122 Type 2 diabetes mellitus with diabetic chronic kidney disease: Secondary | ICD-10-CM | POA: Diagnosis not present

## 2022-10-24 DIAGNOSIS — I700 Atherosclerosis of aorta: Secondary | ICD-10-CM | POA: Diagnosis not present

## 2022-10-24 DIAGNOSIS — E559 Vitamin D deficiency, unspecified: Secondary | ICD-10-CM | POA: Diagnosis not present

## 2022-10-24 DIAGNOSIS — J309 Allergic rhinitis, unspecified: Secondary | ICD-10-CM | POA: Diagnosis not present

## 2022-10-24 DIAGNOSIS — N1832 Chronic kidney disease, stage 3b: Secondary | ICD-10-CM | POA: Diagnosis not present

## 2022-10-24 DIAGNOSIS — E1122 Type 2 diabetes mellitus with diabetic chronic kidney disease: Secondary | ICD-10-CM | POA: Diagnosis not present

## 2022-10-24 DIAGNOSIS — G4733 Obstructive sleep apnea (adult) (pediatric): Secondary | ICD-10-CM | POA: Diagnosis not present

## 2022-10-24 DIAGNOSIS — Z0000 Encounter for general adult medical examination without abnormal findings: Secondary | ICD-10-CM | POA: Diagnosis not present

## 2022-10-24 DIAGNOSIS — I129 Hypertensive chronic kidney disease with stage 1 through stage 4 chronic kidney disease, or unspecified chronic kidney disease: Secondary | ICD-10-CM | POA: Diagnosis not present

## 2022-11-12 ENCOUNTER — Inpatient Hospital Stay (HOSPITAL_COMMUNITY)
Admission: EM | Admit: 2022-11-12 | Discharge: 2022-11-17 | DRG: 183 | Disposition: A | Discharge status: Home Health | Payer: Medicare Other | Attending: Surgery | Admitting: Surgery

## 2022-11-12 ENCOUNTER — Emergency Department (HOSPITAL_COMMUNITY): Payer: Medicare Other

## 2022-11-12 ENCOUNTER — Encounter (HOSPITAL_COMMUNITY): Payer: Self-pay | Admitting: Emergency Medicine

## 2022-11-12 ENCOUNTER — Other Ambulatory Visit: Payer: Self-pay

## 2022-11-12 DIAGNOSIS — F319 Bipolar disorder, unspecified: Secondary | ICD-10-CM | POA: Diagnosis present

## 2022-11-12 DIAGNOSIS — Z7984 Long term (current) use of oral hypoglycemic drugs: Secondary | ICD-10-CM | POA: Diagnosis not present

## 2022-11-12 DIAGNOSIS — E041 Nontoxic single thyroid nodule: Secondary | ICD-10-CM | POA: Diagnosis present

## 2022-11-12 DIAGNOSIS — S300XXA Contusion of lower back and pelvis, initial encounter: Secondary | ICD-10-CM | POA: Diagnosis present

## 2022-11-12 DIAGNOSIS — S2242XA Multiple fractures of ribs, left side, initial encounter for closed fracture: Principal | ICD-10-CM

## 2022-11-12 DIAGNOSIS — S271XXA Traumatic hemothorax, initial encounter: Secondary | ICD-10-CM | POA: Diagnosis present

## 2022-11-12 DIAGNOSIS — E119 Type 2 diabetes mellitus without complications: Secondary | ICD-10-CM | POA: Diagnosis present

## 2022-11-12 DIAGNOSIS — S0990XA Unspecified injury of head, initial encounter: Secondary | ICD-10-CM | POA: Diagnosis not present

## 2022-11-12 DIAGNOSIS — Z96653 Presence of artificial knee joint, bilateral: Secondary | ICD-10-CM | POA: Diagnosis present

## 2022-11-12 DIAGNOSIS — I10 Essential (primary) hypertension: Secondary | ICD-10-CM | POA: Diagnosis present

## 2022-11-12 DIAGNOSIS — S225XXA Flail chest, initial encounter for closed fracture: Principal | ICD-10-CM | POA: Diagnosis present

## 2022-11-12 DIAGNOSIS — M25512 Pain in left shoulder: Secondary | ICD-10-CM | POA: Diagnosis not present

## 2022-11-12 DIAGNOSIS — W010XXA Fall on same level from slipping, tripping and stumbling without subsequent striking against object, initial encounter: Secondary | ICD-10-CM | POA: Diagnosis present

## 2022-11-12 DIAGNOSIS — S199XXA Unspecified injury of neck, initial encounter: Secondary | ICD-10-CM | POA: Diagnosis not present

## 2022-11-12 DIAGNOSIS — K5730 Diverticulosis of large intestine without perforation or abscess without bleeding: Secondary | ICD-10-CM | POA: Diagnosis not present

## 2022-11-12 DIAGNOSIS — R6889 Other general symptoms and signs: Secondary | ICD-10-CM | POA: Diagnosis not present

## 2022-11-12 DIAGNOSIS — Z8249 Family history of ischemic heart disease and other diseases of the circulatory system: Secondary | ICD-10-CM | POA: Diagnosis not present

## 2022-11-12 DIAGNOSIS — Z9049 Acquired absence of other specified parts of digestive tract: Secondary | ICD-10-CM

## 2022-11-12 DIAGNOSIS — J939 Pneumothorax, unspecified: Secondary | ICD-10-CM | POA: Diagnosis not present

## 2022-11-12 DIAGNOSIS — N281 Cyst of kidney, acquired: Secondary | ICD-10-CM | POA: Diagnosis not present

## 2022-11-12 DIAGNOSIS — K862 Cyst of pancreas: Secondary | ICD-10-CM | POA: Diagnosis not present

## 2022-11-12 DIAGNOSIS — R109 Unspecified abdominal pain: Secondary | ICD-10-CM | POA: Diagnosis not present

## 2022-11-12 DIAGNOSIS — W19XXXA Unspecified fall, initial encounter: Secondary | ICD-10-CM | POA: Diagnosis not present

## 2022-11-12 DIAGNOSIS — Y92008 Other place in unspecified non-institutional (private) residence as the place of occurrence of the external cause: Secondary | ICD-10-CM | POA: Diagnosis not present

## 2022-11-12 DIAGNOSIS — S2249XA Multiple fractures of ribs, unspecified side, initial encounter for closed fracture: Secondary | ICD-10-CM | POA: Diagnosis present

## 2022-11-12 DIAGNOSIS — G4733 Obstructive sleep apnea (adult) (pediatric): Secondary | ICD-10-CM | POA: Diagnosis present

## 2022-11-12 DIAGNOSIS — Z743 Need for continuous supervision: Secondary | ICD-10-CM | POA: Diagnosis not present

## 2022-11-12 DIAGNOSIS — N179 Acute kidney failure, unspecified: Secondary | ICD-10-CM | POA: Diagnosis not present

## 2022-11-12 DIAGNOSIS — Z79899 Other long term (current) drug therapy: Secondary | ICD-10-CM | POA: Diagnosis not present

## 2022-11-12 DIAGNOSIS — S270XXA Traumatic pneumothorax, initial encounter: Secondary | ICD-10-CM | POA: Diagnosis not present

## 2022-11-12 DIAGNOSIS — N178 Other acute kidney failure: Secondary | ICD-10-CM | POA: Diagnosis not present

## 2022-11-12 LAB — COMPREHENSIVE METABOLIC PANEL
ALT: 25 U/L (ref 0–44)
AST: 35 U/L (ref 15–41)
Albumin: 3.6 g/dL (ref 3.5–5.0)
Alkaline Phosphatase: 44 U/L (ref 38–126)
Anion gap: 11 (ref 5–15)
BUN: 27 mg/dL — ABNORMAL HIGH (ref 8–23)
CO2: 20 mmol/L — ABNORMAL LOW (ref 22–32)
Calcium: 9.1 mg/dL (ref 8.9–10.3)
Chloride: 105 mmol/L (ref 98–111)
Creatinine, Ser: 1.92 mg/dL — ABNORMAL HIGH (ref 0.44–1.00)
GFR, Estimated: 26 mL/min — ABNORMAL LOW (ref >60)
Glucose, Bld: 134 mg/dL — ABNORMAL HIGH (ref 70–99)
Potassium: 4.8 mmol/L (ref 3.5–5.1)
Sodium: 136 mmol/L (ref 135–145)
Total Bilirubin: 1.3 mg/dL — ABNORMAL HIGH (ref 0.3–1.2)
Total Protein: 7 g/dL (ref 6.5–8.1)

## 2022-11-12 LAB — CBC
HCT: 39.4 % (ref 36.0–46.0)
Hemoglobin: 12.6 g/dL (ref 12.0–15.0)
MCH: 31.4 pg (ref 26.0–34.0)
MCHC: 32 g/dL (ref 30.0–36.0)
MCV: 98.3 fL (ref 80.0–100.0)
Platelets: 133 10*3/uL — ABNORMAL LOW (ref 150–400)
RBC: 4.01 MIL/uL (ref 3.87–5.11)
RDW: 14.6 % (ref 11.5–15.5)
WBC: 10 10*3/uL (ref 4.0–10.5)
nRBC: 0 % (ref 0.0–0.2)

## 2022-11-12 MED ORDER — MORPHINE SULFATE (PF) 4 MG/ML IV SOLN
4.0000 mg | Freq: Once | INTRAVENOUS | Status: AC
  Administered 2022-11-12: 4 mg via INTRAVENOUS
  Filled 2022-11-12: qty 1

## 2022-11-12 NOTE — ED Triage Notes (Signed)
Pt bib ems from home for mechanical fall onto table. Pt states she landed on the table on her left side. Complaining of pain to LUQ, left arm, and left shoulder. Denies loc, head trauma, or blood thinners.   BP 184/98, HR 66, Spo2 88% RA, CBG 133

## 2022-11-12 NOTE — ED Provider Notes (Incomplete)
[facility] EMERGENCY DEPARTMENT AT [facility] Provider Note   CSN: [id number] Arrival date & time: 11/12/22  2039     History {Add pertinent medical, surgical, social history, OB history to HPI:1} Chief Complaint  Patient presents with   Yvonne Peterson is a 77 y.o. female.   Fall  Patient is a 77 year old female with past medical history significant for DM2, HTN, cardiomegaly, obesity, OSA on CPAP, bipolar  She presented emergency room today with complaints primarily of left sided thoracic pain as well as some abdominal discomfort and left shoulder pain but all occurred after she had a mechanical fall that occurred when she slipped and fell onto a 90 degree angle countertop that made contact with her left ribs.  She states that she feels somewhat short of breath.  She denies any head injuries or loss of consciousness no nausea or vomiting.  She slumped onto the countertop and then slowly lowered herself to the ground afterwards because of the severe pain that she was experiencing.  She has been able to walk.  She denies any hip pain.  No lower extremity pain.  She does endorse some left shoulder discomfort denies any numbness or weakness no slurred speech confusion or difficulty speaking.     Home Medications Prior to Admission medications   Medication Sig Start Date End Date Taking? Authorizing Provider  amLODipine (NORVASC) 5 MG tablet Take 5 mg by mouth daily.    [provider]  cetirizine (ZYRTEC) 10 MG tablet Take 10 mg by mouth daily.    [provider]  Chlorpheniramine-Acetaminophen (CORICIDIN HBP COLD/FLU PO) Take 1 tablet by mouth 2 (two) times daily. Maximum Strength    [provider]  diphenhydrAMINE (BENADRYL) 25 MG tablet Take 1 tablet (25 mg total) by mouth every 6 (six) hours as needed for itching (Rash). 08/12/14   Eber Hong, MD  famotidine (PEPCID) 20 MG tablet Take 1 tablet (20 mg total) by mouth 2 (two) times  daily. 08/12/14   Eber Hong, MD  labetalol (NORMODYNE) 200 MG tablet Take 200 mg by mouth 2 (two) times daily.    [provider]  losartan (COZAAR) 100 MG tablet Take 100 mg by mouth daily.    [provider]  metFORMIN (GLUCOPHAGE-XR) 750 MG 24 hr tablet Take 750 mg by mouth daily with breakfast.     [provider]  oxcarbazepine (TRILEPTAL) 600 MG tablet Take 600 mg by mouth 2 (two) times daily.    [provider]  PARoxetine (PAXIL) 40 MG tablet Take 40 mg by mouth every morning.    [provider]  predniSONE (DELTASONE) 20 MG tablet Take 2 tablets (40 mg total) by mouth daily. 08/12/14   Eber Hong, MD      Allergies    Sulfa antibiotics and Gabapentin    Review of Systems   Review of Systems  Physical Exam Updated Vital Signs BP (!) 191/98   Pulse 61   Temp 98.5 F (36.9 C) (Oral)   Resp (!) 23   Ht 5\' 4"  (1.626 m)   Wt 88.9 kg   SpO2 96%   BMI 33.64 kg/m  Physical Exam Vitals and nursing note reviewed.  Constitutional:      General: She is not in acute distress. HENT:     Head: Normocephalic and atraumatic.     Nose: Nose normal.  Eyes:     General: No scleral icterus. Cardiovascular:     Rate  and Rhythm: Normal rate and regular rhythm.     Pulses: Normal pulses.     Heart sounds: Normal heart sounds.  Pulmonary:     Effort: Pulmonary effort is normal. No respiratory distress.     Breath sounds: No wheezing.  Abdominal:     Palpations: Abdomen is soft.     Tenderness: There is no abdominal tenderness.  Musculoskeletal:     Cervical back: Normal range of motion.     Right lower leg: No edema.     Left lower leg: No edema.     Comments: Exquisite tenderness to palpation of the left lateral thorax, no bruising step-off deformity or lacerations abrasions or skin changes.  Some discomfort with range of motion of left shoulder  Bilateral knees with remote well-healed surgical scars.  No lower or upper extremity  bony tenderness.  No C, T, L-spine tenderness  Skin:    General: Skin is warm and dry.     Capillary Refill: Capillary refill takes less than 2 seconds.  Neurological:     Mental Status: She is alert. Mental status is at baseline.  Psychiatric:        Mood and Affect: Mood normal.        Behavior: Behavior normal.     ED Results / Procedures / Treatments   Labs (all labs ordered are listed, but only abnormal results are displayed) Labs Reviewed  CBC  COMPREHENSIVE METABOLIC PANEL    EKG None  Radiology No results found.  Procedures Procedures  {Document cardiac monitor, telemetry assessment procedure when appropriate:1}  Medications Ordered in ED Medications  morphine (PF) 4 MG/ML injection 4 mg (has no administration in time range)    ED Course/ Medical Decision Making/ A&P   {   Click here for ABCD2, HEART and other calculatorsREFRESH Note before signing :1}                          Medical Decision Making Amount and/or Complexity of Data Reviewed Labs: ordered. Radiology: ordered.  Risk Prescription drug management.   This patient presents to the ED for concern of ***, this involves a number of treatment options, and is a complaint that carries with it a *** risk of complications and morbidity. A differential diagnosis was considered for the patient's symptoms which is discussed below:   ***   Co morbidities: Discussed in HPI   Brief History:  Patient is a 77 year old female with past medical history significant for DM2, HTN, cardiomegaly, obesity, OSA on CPAP, bipolar  She presented emergency room today with complaints primarily of left sided thoracic pain as well as some abdominal discomfort and left shoulder pain but all occurred after she had a mechanical fall that occurred when she slipped and fell onto a 90 degree angle countertop that made contact with her left ribs.  She states that she feels somewhat short of breath.  She denies any head  injuries or loss of consciousness no nausea or vomiting.  She slumped onto the countertop and then slowly lowered herself to the ground afterwards because of the severe pain that she was experiencing.  She has been able to walk.  She denies any hip pain.  No lower extremity pain.  She does endorse some left shoulder discomfort denies any numbness or weakness no slurred speech confusion or difficulty speaking.    EMR reviewed including pt PMHx, past surgical history and past visits to ER.   See HPI  for more details   Lab Tests:   {Blank single:19197::"I ordered and independently interpreted labs. Labs notable for","I personally reviewed all laboratory work and imaging. Metabolic panel without any acute abnormality specifically kidney function within normal limits and no significant electrolyte abnormalities. CBC without leukocytosis or significant anemia."}   Imaging Studies:  {Blank single:19197::"NAD. I personally reviewed all imaging studies and no acute abnormality found. I agree with radiology interpretation.","Abnormal findings. I personally reviewed all imaging studies. Imaging notable for","No imaging studies ordered for this patient"}    Cardiac Monitoring:  {Blank single:19197::"The patient was maintained on a cardiac monitor.  I personally viewed and interpreted the cardiac monitored which showed an underlying rhythm of:","NA"} {Blank single:19197::"EKG non-ischemic","NA"}   Medicines ordered:  I ordered medication including ***  for *** Reevaluation of the patient after these medicines showed that the patient {resolved/improved/worsened:23923::"improved"} I have reviewed the patients home medicines and have made adjustments as needed   Critical Interventions:  ***   Consults/Attending Physician   {Blank single:19197::"I requested consultation with ***,  and discussed lab and imaging findings as well as pertinent plan - they recommend: ***","I discussed this case  with my attending physician who cosigned this note including patient's presenting symptoms, physical exam, and planned diagnostics and interventions. Attending physician stated agreement with plan or made changes to plan which were implemented."}   Reevaluation:  After the interventions noted above I re-evaluated patient and found that they have :{resolved/improved/worsened:23923::"improved"}   Social Determinants of Health:  {Blank single:19197::"Given cab voucher","Social work/case management involved","The patient's social determinants of health were a factor in the care of this patient"}    Problem List / ED Course:  ***   Dispostion:  After consideration of the diagnostic results and the patients response to treatment, I feel that the patent would benefit from ***        Final Clinical Impression(s) / ED Diagnoses Final diagnoses:  None    Rx / DC Orders ED Discharge Orders     None

## 2022-11-13 ENCOUNTER — Inpatient Hospital Stay (HOSPITAL_COMMUNITY): Payer: Medicare Other

## 2022-11-13 ENCOUNTER — Encounter (HOSPITAL_COMMUNITY): Payer: Self-pay

## 2022-11-13 DIAGNOSIS — Z96653 Presence of artificial knee joint, bilateral: Secondary | ICD-10-CM | POA: Diagnosis present

## 2022-11-13 DIAGNOSIS — Z8249 Family history of ischemic heart disease and other diseases of the circulatory system: Secondary | ICD-10-CM | POA: Diagnosis not present

## 2022-11-13 DIAGNOSIS — W010XXA Fall on same level from slipping, tripping and stumbling without subsequent striking against object, initial encounter: Secondary | ICD-10-CM | POA: Diagnosis present

## 2022-11-13 DIAGNOSIS — N281 Cyst of kidney, acquired: Secondary | ICD-10-CM | POA: Diagnosis present

## 2022-11-13 DIAGNOSIS — N179 Acute kidney failure, unspecified: Secondary | ICD-10-CM | POA: Diagnosis present

## 2022-11-13 DIAGNOSIS — S300XXA Contusion of lower back and pelvis, initial encounter: Secondary | ICD-10-CM | POA: Diagnosis present

## 2022-11-13 DIAGNOSIS — E041 Nontoxic single thyroid nodule: Secondary | ICD-10-CM | POA: Diagnosis present

## 2022-11-13 DIAGNOSIS — Y92008 Other place in unspecified non-institutional (private) residence as the place of occurrence of the external cause: Secondary | ICD-10-CM | POA: Diagnosis not present

## 2022-11-13 DIAGNOSIS — Z7984 Long term (current) use of oral hypoglycemic drugs: Secondary | ICD-10-CM | POA: Diagnosis not present

## 2022-11-13 DIAGNOSIS — G4733 Obstructive sleep apnea (adult) (pediatric): Secondary | ICD-10-CM | POA: Diagnosis present

## 2022-11-13 DIAGNOSIS — S271XXA Traumatic hemothorax, initial encounter: Secondary | ICD-10-CM | POA: Diagnosis present

## 2022-11-13 DIAGNOSIS — E119 Type 2 diabetes mellitus without complications: Secondary | ICD-10-CM | POA: Diagnosis present

## 2022-11-13 DIAGNOSIS — F319 Bipolar disorder, unspecified: Secondary | ICD-10-CM | POA: Diagnosis present

## 2022-11-13 DIAGNOSIS — M25512 Pain in left shoulder: Secondary | ICD-10-CM | POA: Diagnosis present

## 2022-11-13 DIAGNOSIS — I10 Essential (primary) hypertension: Secondary | ICD-10-CM | POA: Diagnosis present

## 2022-11-13 DIAGNOSIS — Z9049 Acquired absence of other specified parts of digestive tract: Secondary | ICD-10-CM | POA: Diagnosis not present

## 2022-11-13 DIAGNOSIS — S225XXA Flail chest, initial encounter for closed fracture: Secondary | ICD-10-CM | POA: Diagnosis present

## 2022-11-13 DIAGNOSIS — Z79899 Other long term (current) drug therapy: Secondary | ICD-10-CM | POA: Diagnosis not present

## 2022-11-13 DIAGNOSIS — K862 Cyst of pancreas: Secondary | ICD-10-CM | POA: Diagnosis present

## 2022-11-13 DIAGNOSIS — S2249XA Multiple fractures of ribs, unspecified side, initial encounter for closed fracture: Secondary | ICD-10-CM | POA: Diagnosis present

## 2022-11-13 LAB — GLUCOSE, CAPILLARY
Glucose-Capillary: 121 mg/dL — ABNORMAL HIGH (ref 70–99)
Glucose-Capillary: 104 mg/dL — ABNORMAL HIGH (ref 70–99)
Glucose-Capillary: 141 mg/dL — ABNORMAL HIGH (ref 70–99)
Glucose-Capillary: 143 mg/dL — ABNORMAL HIGH (ref 70–99)

## 2022-11-13 MED ORDER — SODIUM CHLORIDE 0.9 % IV BOLUS
1000.0000 mL | Freq: Once | INTRAVENOUS | Status: AC
  Administered 2022-11-13: 1000 mL via INTRAVENOUS

## 2022-11-13 MED ORDER — ONDANSETRON HCL 4 MG/2ML IJ SOLN
4.0000 mg | Freq: Four times a day (QID) | INTRAMUSCULAR | Status: DC | PRN
  Administered 2022-11-13 – 2022-11-15 (×2): 4 mg via INTRAVENOUS
  Filled 2022-11-13 (×2): qty 2

## 2022-11-13 MED ORDER — ONDANSETRON 4 MG PO TBDP: 4.0000 mg | ORAL_TABLET | Freq: Four times a day (QID) | ORAL | Status: DC | PRN

## 2022-11-13 MED ORDER — METHOCARBAMOL 500 MG PO TABS
500.0000 mg | ORAL_TABLET | Freq: Three times a day (TID) | ORAL | Status: DC
  Administered 2022-11-13 – 2022-11-14 (×4): 500 mg via ORAL
  Filled 2022-11-13 (×4): qty 1

## 2022-11-13 MED ORDER — TRAMADOL HCL 50 MG PO TABS
50.0000 mg | ORAL_TABLET | Freq: Four times a day (QID) | ORAL | Status: DC | PRN
  Administered 2022-11-13 (×2): 50 mg via ORAL
  Filled 2022-11-13 (×2): qty 1

## 2022-11-13 MED ORDER — SODIUM CHLORIDE 0.9 % IV SOLN: INTRAVENOUS | Status: DC

## 2022-11-13 MED ORDER — METHOCARBAMOL 1000 MG/10ML IJ SOLN
500.0000 mg | Freq: Three times a day (TID) | INTRAVENOUS | Status: DC
  Filled 2022-11-13: qty 5

## 2022-11-13 MED ORDER — LOSARTAN POTASSIUM 50 MG PO TABS
100.0000 mg | ORAL_TABLET | Freq: Every day | ORAL | Status: DC
  Administered 2022-11-13 – 2022-11-17 (×5): 100 mg via ORAL
  Filled 2022-11-13 (×5): qty 2

## 2022-11-13 MED ORDER — FENTANYL CITRATE PF 50 MCG/ML IJ SOSY
75.0000 ug | PREFILLED_SYRINGE | INTRAMUSCULAR | Status: DC | PRN
  Administered 2022-11-13: 75 ug via INTRAVENOUS
  Filled 2022-11-13: qty 2

## 2022-11-13 MED ORDER — ONDANSETRON HCL 4 MG/2ML IJ SOLN
4.0000 mg | Freq: Three times a day (TID) | INTRAMUSCULAR | Status: DC | PRN
  Filled 2022-11-13: qty 2

## 2022-11-13 MED ORDER — ENOXAPARIN SODIUM 30 MG/0.3ML IJ SOSY
30.0000 mg | PREFILLED_SYRINGE | INTRAMUSCULAR | Status: DC
  Administered 2022-11-14 – 2022-11-16 (×3): 30 mg via SUBCUTANEOUS
  Filled 2022-11-13 (×3): qty 0.3

## 2022-11-13 MED ORDER — MORPHINE SULFATE (PF) 2 MG/ML IV SOLN
1.0000 mg | INTRAVENOUS | Status: DC | PRN
  Administered 2022-11-13 – 2022-11-14 (×4): 2 mg via INTRAVENOUS
  Filled 2022-11-13 (×4): qty 1

## 2022-11-13 MED ORDER — PHENOL 1.4 % MT LIQD
1.0000 | OROMUCOSAL | Status: DC | PRN
  Administered 2022-11-13 – 2022-11-14 (×3): 1 via OROMUCOSAL
  Filled 2022-11-13: qty 177

## 2022-11-13 MED ORDER — HYDRALAZINE HCL 20 MG/ML IJ SOLN
10.0000 mg | INTRAMUSCULAR | Status: DC | PRN
  Administered 2022-11-13: 10 mg via INTRAVENOUS
  Filled 2022-11-13: qty 1

## 2022-11-13 MED ORDER — METOPROLOL TARTRATE 5 MG/5ML IV SOLN: 5.0000 mg | Freq: Four times a day (QID) | INTRAVENOUS | Status: DC | PRN

## 2022-11-13 MED ORDER — LABETALOL HCL 100 MG PO TABS
200.0000 mg | ORAL_TABLET | Freq: Two times a day (BID) | ORAL | Status: DC
  Administered 2022-11-13: 200 mg via ORAL
  Filled 2022-11-13: qty 2

## 2022-11-13 MED ORDER — ACETAMINOPHEN 500 MG PO TABS
1000.0000 mg | ORAL_TABLET | Freq: Four times a day (QID) | ORAL | Status: DC
  Administered 2022-11-13 – 2022-11-17 (×17): 1000 mg via ORAL
  Filled 2022-11-13 (×19): qty 2

## 2022-11-13 MED ORDER — POLYETHYLENE GLYCOL 3350 17 G PO PACK: 17.0000 g | PACK | Freq: Every day | ORAL | Status: DC | PRN

## 2022-11-13 MED ORDER — TRAMADOL HCL 50 MG PO TABS: 25.0000 mg | ORAL_TABLET | Freq: Four times a day (QID) | ORAL | Status: DC | PRN

## 2022-11-13 MED ORDER — LABETALOL HCL 100 MG PO TABS
200.0000 mg | ORAL_TABLET | Freq: Once | ORAL | Status: AC
  Administered 2022-11-13: 200 mg via ORAL
  Filled 2022-11-13: qty 2

## 2022-11-13 MED ORDER — INSULIN ASPART 100 UNIT/ML IJ SOLN
0.0000 [IU] | Freq: Three times a day (TID) | INTRAMUSCULAR | Status: DC
  Administered 2022-11-15 – 2022-11-16 (×4): 1 [IU] via SUBCUTANEOUS
  Administered 2022-11-16: 2 [IU] via SUBCUTANEOUS
  Administered 2022-11-17: 1 [IU] via SUBCUTANEOUS
  Filled 2022-11-13: qty 0.09

## 2022-11-13 MED ORDER — AMLODIPINE BESYLATE 5 MG PO TABS
5.0000 mg | ORAL_TABLET | Freq: Every day | ORAL | Status: DC
  Administered 2022-11-13 – 2022-11-17 (×5): 5 mg via ORAL
  Filled 2022-11-13 (×5): qty 1

## 2022-11-13 MED ORDER — DOCUSATE SODIUM 100 MG PO CAPS
100.0000 mg | ORAL_CAPSULE | Freq: Two times a day (BID) | ORAL | Status: DC
  Administered 2022-11-13 – 2022-11-16 (×9): 100 mg via ORAL
  Filled 2022-11-13 (×10): qty 1

## 2022-11-13 NOTE — Progress Notes (Signed)
   11/13/22 2315  BiPAP/CPAP/SIPAP  $ Non-Invasive Ventilator  Non-Invasive Vent Set Up  $ Face Mask Medium Yes  BiPAP/CPAP/SIPAP Pt Type Adult  BiPAP/CPAP/SIPAP Resmed  Mask Type Full face mask  Mask Size Medium  Respiratory Rate 18 breaths/min  IPAP  (max 18)  EPAP  (max 10)  Pressure Support  (pressure of 4)  Patient Home Equipment No  Auto Titrate Yes  BiPAP/CPAP /SiPAP Vitals  SpO2 96 %

## 2022-11-13 NOTE — Discharge Instructions (Addendum)
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 The radiologist recommend follow-up with MRI in 2 years to re-evaluate. Please follow up with your primary care provider for this.  - Left thyroid nodule measuring 1.9 x 1.4 cm. The radiologist recommended a follow-up thyroid ultrasound for this. Please follow up with your primary care provider for this.  - Bilateral renal cysts. Hyperdense lesion in the left kidney was favored as a complex cyst. The radiologist recommended further evaluation with an ultrasound or MRI if clinically warranted. Please follow up with your primary care to determine this.    RIB FRACTURES  HOME INSTRUCTIONS   PAIN CONTROL:  Pain is best controlled by a usual combination of three different methods TOGETHER:  Ice/Heat Over the counter pain medication Prescription pain medication You may experience some swelling and bruising in area of broken ribs. Ice packs or heating pads (30-60 minutes up to 6 times a day) will help. Use ice for the first few days to help decrease swelling and bruising, then switch to heat to help relax tight/sore spots and speed recovery. Some people prefer to use ice alone, heat alone, alternating between ice & heat. Experiment to what works for you. Swelling and bruising can take several weeks to resolve.  It is helpful to take an over-the-counter pain medication regularly for the first few weeks. Choose one of the following that works best for you:  Acetaminophen (Tylenol, etc) 500-650mg  four times a day (every meal & bedtime) Do not use NSAIDs such as Advil, Ibuprofen, Naproxen, Naprosyn etc given your history of elevated creatinine A prescription for pain medication (such as Ultram etc) may be given to you upon discharge. Take your pain medication as prescribed.  If you are having problems/concerns with the prescription medicine (does not control pain, nausea, vomiting, rash, itching, etc), please call us  [phone] to see if we need to switch you to a different pain medicine that will work better for you and/or control your side effect better. If you need a refill on your pain medication, please contact your pharmacy. They will contact our office to request authorization. Prescriptions will not be filled after 5 pm or on week-ends. Avoid getting constipated. When taking pain medications, it is common to experience some constipation. Increasing fluid intake and taking a fiber supplement (such as Metamucil, Citrucel, FiberCon, MiraLax, etc) 1-2 times a day regularly will usually help prevent this problem from occurring. A mild laxative (prune juice, Milk of Magnesia, MiraLax, etc) should be taken according to package directions if there are no bowel movements after 48 hours.  Watch out for diarrhea. If you have many loose bowel movements, simplify your diet to bland foods & liquids for a few days. Stop any stool softeners and decrease your fiber supplement. Switching to mild anti-diarrheal medications (Kayopectate, Pepto Bismol) can help. If this worsens or does not improve, please call us. FOLLOW UP  If a follow up appointment is needed one will be scheduled for you. If none is needed with our trauma team, please follow up with your primary care provider within 2-3 weeks from discharge. Please call [facility] at [phone] if you have any questions about follow up.  If you have any orthopedic or other injuries you will need to follow up as outlined in your follow up instructions.   WHEN TO CALL us [phone]:  Poor pain control Reactions / problems with new medications (rash/itching, nausea, etc)  Fever over  101.5 F (38.5 C) Worsening swelling or bruising Worsening pain, productive cough, difficulty breathing or any other concerning symptoms  The clinic staff is available to answer your questions during regular business hours (8:30am-5pm). Please don't hesitate to call and ask to speak to one of  our nurses for clinical concerns.  If you have a medical emergency, go to the nearest emergency room or call 911.  A surgeon from [facility] is always on call at the [facility], [place]  [address], [place], [place] [address] ?  MAIN: [phone] ? TOLL FREE: [phone] ?  FAX [phone]  [email or web address]      Information on Rib Fractures  A rib fracture is a break or crack in one of the bones of the ribs. The ribs are long, curved bones that wrap around your chest and attach to your spine and your breastbone. The ribs protect your heart, lungs, and other organs in the chest. A broken or cracked rib is often painful but is not usually serious. Most rib fractures heal on their own over time. However, rib fractures can be more serious if multiple ribs are broken or if broken ribs move out of place and push against other structures or organs. What are the causes? This condition is caused by: Repetitive movements with high force, such as pitching a baseball or having severe coughing spells. A direct blow to the chest, such as a sports injury, a car accident, or a fall. Cancer that has spread to the bones, which can weaken bones and cause them to break. What are the signs or symptoms? Symptoms of this condition include: Pain when you breathe in or cough. Pain when someone presses on the injured area. Feeling short of breath. How is this diagnosed? This condition is diagnosed with a physical exam and medical history. Imaging tests may also be done, such as: Chest X-ray. CT scan. MRI. Bone scan. Chest ultrasound. How is this treated? Treatment for this condition depends on the severity of the fracture. Most rib fractures usually heal on their own in 1-3 months. Sometimes healing takes longer if there is a cough that does not stop or if there are other activities that make the injury worse (aggravating  factors). While you heal, you will be given medicines to control the pain. You will also be taught deep breathing exercises. Severe injuries may require hospitalization or surgery. Follow these instructions at home: Managing pain, stiffness, and swelling If directed, apply ice to the injured area. Put ice in a plastic bag. Place a towel between your skin and the bag. Leave the ice on for 20 minutes, 2-3 times a day. Take over-the-counter and prescription medicines only as told by your health care provider. Activity Avoid a lot of activity and any activities or movements that cause pain. Be careful during activities and avoid bumping the injured rib. Slowly increase your activity as told by your health care provider. General instructions Do deep breathing exercises as told by your health care provider. This helps prevent pneumonia, which is a common complication of a broken rib. Your health care provider may instruct you to: Take deep breaths several times a day. Try to cough several times a day, holding a pillow against the injured area. Use a device called incentive spirometer to practice deep breathing several times a day. Drink enough fluid to keep your urine pale yellow. Do not wear a rib belt or binder. These restrict breathing,  which can lead to pneumonia. Keep all follow-up visits as told by your health care provider. This is important. Contact a health care provider if: You have a fever. Get help right away if: You have difficulty breathing or you are short of breath. You develop a cough that does not stop, or you cough up thick or bloody sputum. You have nausea, vomiting, or pain in your abdomen. Your pain gets worse and medicine does not help. Summary A rib fracture is a break or crack in one of the bones of the ribs. A broken or cracked rib is often painful but is not usually serious. Most rib fractures heal on their own over time. Treatment for this condition depends on the  severity of the fracture. Avoid a lot of activity and any activities or movements that cause pain. This information is not intended to replace advice given to you by your health care provider. Make sure you discuss any questions you have with your health care provider. Document Released: 05/29/2005 Document Revised: 08/28/2016 Document Reviewed: 08/28/2016 Elsevier Interactive Patient Education  2019 [employer].

## 2022-11-13 NOTE — ED Notes (Signed)
Called [facility] for purple man.   

## 2022-11-13 NOTE — ED Notes (Signed)
[facility] called. 

## 2022-11-13 NOTE — H&P (Signed)
BABS GRONBERG 1946-01-26  [id number].    Requesting MD: Dr. Bebe Shaggy Chief Complaint/Reason for Consult: mechanical fall   HPI:  77 year old female with medical history significant for type II DM, hypertension, cardiomegaly, OSA on CPAP, bipolar, obesity who presented to [facility] ED 6/2 with left-sided thoracic pain, left shoulder pain, abdominal discomfort.  Pain began after a mechanical fall at ~7pm on date of presentation to ED.  She states she slipped and fell against a countertop striking her left ribs before she slowly lowered herself to the ground.  She has associated shortness of breath.  She denied head trauma or LOC. No associated dizziness, lightheadedness, visual changes, n/t/w of the extremities before the fall. She did report she had a mechanical fall on 5/25 as well and has been having HA and nausea since that time. She did not seek medical attention/workup after the fall on 5/25. Denies emesis, RUE pain, lower extremity pain, abdominal pain, numbness or weakness in extremities. She denies pain in the LUE but rather has pain in her left ribs with movement of the LUE.   She underwent workup in the ED with findings of multiple left-sided rib fractures with flail chest and trace left hydropneumothorax.  Trauma service consulted for admission inpatient transferred to [facility].  She denies tobacco, alcohol or drug use.  Patient is widowed. Lives at home alone. Walks independently at baseline without assistive device. She has friends close by that can help at d/c if needed. Allergies: Sulfa antibiotics-anaphylaxis, gabapentin Blood thinners: None Past Surgeries: Appendectomy, lumbar fusion, ovarian cyst removal, cholecystectomy   ROS: ROS reviewed and as above  Family History  Problem Relation Age of Onset   Coronary artery disease Mother    Heart failure Mother    Heart disease Mother    Coronary artery disease Father    Cancer Father        kidney    Cancer Brother        brain    Past Medical History:  Diagnosis Date   Anemia    Anxiety    Arthritis    "hands; knees" (09/10/2013)   Bipolar affective (HCC)    Complication of anesthesia    panic attacks   Depression    Gait instability 09/06/2013   admitted 09/10/2013   GERD (gastroesophageal reflux disease)    History of blood transfusion    "S/P back OR"   Hypertension    Migraines    "years ago" (09/10/2013)   OSA on CPAP 03/29/2012   PONV (postoperative nausea and vomiting)    Type II diabetes mellitus (HCC)     Past Surgical History:  Procedure Laterality Date   APPENDECTOMY  June 1967   BACK SURGERY     BLADDER SUSPENSION  1992   CARPAL TUNNEL RELEASE Right approx. 2000   CATARACT EXTRACTION W/ INTRAOCULAR LENS  IMPLANT, BILATERAL  2012-2013   left eye - Nov. 2012, right eye - Mar. 2013   CHOLECYSTECTOMY  ~ 1989   HEMORRHOID BANDING  08/2013   NASAL SINUS SURGERY  1978   OVARIAN CYST REMOVAL  1967   POSTERIOR LUMBAR FUSION  12/2012   TONSILLECTOMY  1955   TOTAL KNEE ARTHROPLASTY Bilateral 2010-2011   Left knee - Dec. 2010, right kee - March 2011    Social History:  reports that she has never smoked. She has never used smokeless tobacco. She reports current alcohol use. She reports that she does not use  drugs.  Allergies:  Allergies  Allergen Reactions   Sulfa Antibiotics Anaphylaxis and Swelling    Swelling in nasal passage   Gabapentin     Medications Prior to Admission  Medication Sig Dispense Refill   amLODipine (NORVASC) 5 MG tablet Take 5 mg by mouth daily.     B Complex Vitamins (VITAMIN B COMPLEX) TABS Take 1 tablet by mouth daily.     cetirizine (ZYRTEC) 10 MG tablet Take 10 mg by mouth daily.     Ferrous Sulfate (IRON PO) Take 1 tablet by mouth daily.     magnesium 30 MG tablet Take 30 mg by mouth daily.     Multiple Vitamins-Minerals (ZINC PO) Take 1 tablet by mouth daily.     VITAMIN D-VITAMIN K PO Take 1 tablet by mouth daily.      diphenhydrAMINE (BENADRYL) 25 MG tablet Take 1 tablet (25 mg total) by mouth every 6 (six) hours as needed for itching (Rash). (Patient not taking: Reported on 11/13/2022) 30 tablet 0   famotidine (PEPCID) 20 MG tablet Take 1 tablet (20 mg total) by mouth 2 (two) times daily. (Patient not taking: Reported on 11/13/2022) 30 tablet 0   losartan (COZAAR) 100 MG tablet Take 100 mg by mouth daily.     predniSONE (DELTASONE) 20 MG tablet Take 2 tablets (40 mg total) by mouth daily. (Patient not taking: Reported on 11/13/2022) 10 tablet 0    Blood pressure (!) 152/83, pulse (!) 58, temperature 99 F (37.2 C), temperature source Oral, resp. rate (!) 24, height 5\' 4"  (1.626 m), weight 88.9 kg, SpO2 97 %. Physical Exam: General: pleasant, WD, female who is laying in bed in NAD HEENT: head is normocephalic, atraumatic.  Sclera are noninjected.  R pupils 3mm, L pupil 2mm - pupils equal, round and reactive. EOMs intact.  Ears and nose without any masses or lesions.  Mouth is pink and moist Heart: regular, rate, and rhythm. Palpable radial and pedal pulses bilaterally Lungs: CTAB, no wheezes, rhonchi, or rales noted.  Respiratory effort nonlabored on 2 lpm O2 via [place]. Pulling 750 on IS.  Abd: Soft, NT, ND, +BS, no masses, hernias, or organomegaly Skin: warm and dry with no masses, lesions, or rashes Neuro: GCS 15. Thought process intact. Able speech. Follows commands. Cranial nerves 3-12 grossly intact except for R pupil 3mm and L pupil 2mm as noted above. MAE's. Appropriate and equal grip strength b/l. Sensation is normal throughout. Gait not assessed.  Psych: A&Ox3 with an appropriate affect.  Msk:  RUE: No gross deformities of joints or skin. Able passive/active shoulder, elbow, wrist and hand range of motion without pain.  No tenderness over shoulder, upper arm, elbow, forearm, wrists or hand. Radial 2+.  LUE: No gross deformities of joints or skin. Able passive/active elbow, wrist and hand range of motion without  pain. No tenderness over shoulder, upper arm, elbow, forearm, wrists or hand. Does not range L shoulder actively 2/2 pain in left ribs. Radial 2+.  RLE: No sacral crepitus.  Negative logroll test. Able passive/active range of motion of hip, knee, ankle without pain.  No tenderness over hip, upper legs, knee, lower leg, ankle or feet.  No lower extremity edema.  LLE: No sacral crepitus.  Negative logroll test. Able passive/active range of motion of hip, knee and ankle without pain.  No tenderness over hip, upper legs, knee, lower leg, ankle or foot.  No lower extremity edema.  No calf tenderness.      Results for  orders placed or performed during the hospital encounter of 11/12/22 (from the past 48 hour(s))  CBC     Status: Abnormal   Collection Time: 11/12/22 10:37 PM  Result Value Ref Range   WBC 10.0 4.0 - 10.5 K/uL   RBC 4.01 3.87 - 5.11 MIL/uL   Hemoglobin 12.6 12.0 - 15.0 g/dL   HCT 82.9 56.2 - 13.0 %   MCV 98.3 80.0 - 100.0 fL   MCH 31.4 26.0 - 34.0 pg   MCHC 32.0 30.0 - 36.0 g/dL   RDW 86.5 78.4 - 69.6 %   Platelets 133 (L) 150 - 400 K/uL    Comment: REPEATED TO VERIFY PLATELET COUNT CONFIRMED BY SMEAR    nRBC 0.0 0.0 - 0.2 %    Comment: Performed at [facility], [address] [place], [place] [address]  Comprehensive metabolic panel     Status: Abnormal   Collection Time: 11/12/22 10:37 PM  Result Value Ref Range   Sodium 136 135 - 145 mmol/L   Potassium 4.8 3.5 - 5.1 mmol/L    Comment: HEMOLYSIS AT THIS LEVEL MAY AFFECT RESULT   Chloride 105 98 - 111 mmol/L   CO2 20 (L) 22 - 32 mmol/L   Glucose, Bld 134 (H) 70 - 99 mg/dL    Comment: Glucose reference range applies only to samples taken after fasting for at least 8 hours.   BUN 27 (H) 8 - 23 mg/dL   Creatinine, Ser 4.13 (H) 0.44 - 1.00 mg/dL   Calcium 9.1 8.9 - 24.4 mg/dL   Total Protein 7.0 6.5 - 8.1 g/dL   Albumin 3.6 3.5 - 5.0 g/dL   AST 35 15 - 41 U/L    Comment: HEMOLYSIS AT THIS LEVEL MAY  AFFECT RESULT   ALT 25 0 - 44 U/L    Comment: HEMOLYSIS AT THIS LEVEL MAY AFFECT RESULT   Alkaline Phosphatase 44 38 - 126 U/L   Total Bilirubin 1.3 (H) 0.3 - 1.2 mg/dL    Comment: HEMOLYSIS AT THIS LEVEL MAY AFFECT RESULT   GFR, Estimated 26 (L) >60 mL/min    Comment: (NOTE) Calculated using the CKD-EPI Creatinine Equation (2021)    Anion gap 11 5 - 15    Comment: Performed at [facility], [address], [place], [place] [address]   DG Chest Port 1 View  Result Date: 11/13/2022 CLINICAL DATA:  Pneumo thorax. EXAM: PORTABLE CHEST 1 VIEW COMPARISON:  Chest CT yesterday FINDINGS: The trace left pneumothorax on CT is not seen by radiograph. Left pleural thickening related to multiple left rib fractures. Mild retrocardiac opacity, no pleural effusion on CT is not well-defined. Cardiomegaly. The right lung is clear. IMPRESSION: 1. The known small left pneumothorax on CT is not seen by radiograph. 2. Left pleural thickening related to multiple left rib fractures. Retrocardiac opacity as seen on CT. 3. Cardiomegaly. Electronically Signed   By: Narda Rutherford M.D.   On: 11/13/2022 02:17   CT CHEST ABDOMEN PELVIS WO CONTRAST  Result Date: 11/12/2022 CLINICAL DATA:  Chest trauma with fall onto the left ribs. EXAM: CT CHEST, ABDOMEN AND PELVIS WITHOUT CONTRAST TECHNIQUE: Multidetector CT imaging of the chest, abdomen and pelvis was performed following the standard protocol without IV contrast. RADIATION DOSE REDUCTION: This exam was performed according to the departmental dose-optimization program which includes automated exposure control, adjustment of the mA and/or kV according to patient size and/or use of iterative reconstruction technique. COMPARISON:  MRI abdomen 01/19/2022. CT  abdomen and pelvis 01/20/2016. FINDINGS: CT CHEST FINDINGS Cardiovascular: Heart is mildly enlarged. There is no pericardial effusion. Aorta is normal in size. There are atherosclerotic calcifications of the  aorta. Mediastinum/Nodes: There is a posterior left thyroid nodule measuring 1.9 by 1.4 cm. There are no enlarged mediastinal or hilar lymph nodes. The esophagus is within normal limits. There is no evidence for pneumomediastinum or mediastinal hematoma. Lungs/Pleura: There is left lower lobe airspace disease. There is some plugging of left lower lobe bronchi. There is minimal right lower lobe airspace disease as well. There is trace left hydropneumothorax. Musculoskeletal: Acute left anterior third and fourth rib fractures are seen, minimally displaced. The left seventh, eighth and ninth rib fractures are fractured in 2 spots both laterally and posteriorly. There is an additional lateral left seventh rib fracture. Posterior rib fractures appear displaced. Intramuscular lipoma is present posterior to the right scapula measuring 2.3 x 2.6 x 4.9 cm. There is lower left chest wall emphysema and intramuscular hematoma adjacent to rib fractures. CT ABDOMEN PELVIS FINDINGS Hepatobiliary: Gallbladder surgically absent. There is no biliary ductal dilatation. There is a right hepatic cyst measuring 2.5 cm. No other focal liver abnormality. Pancreas: No acute findings. Rounded 12 mm cystic lesion in the tail the pancreas again noted. Spleen: No splenic injury or perisplenic hematoma. Adrenals/Urinary Tract: No adrenal hemorrhage or renal injury identified. Bladder is unremarkable. Bilateral renal cysts are present measuring 8.4 cm on the right and 6.6 cm on the left. Rounded hyperdense area in the left kidney measures 9 mm favored as a complex cysts. Stomach/Bowel: Stomach is within normal limits. Appendix appears normal. No evidence of bowel wall thickening, distention, or inflammatory changes. There is no bowel obstruction, focal inflammation or free air. There is no pneumatosis. There is colonic diverticulosis. The appendix is not seen. Cecum is high riding and distended with air-fluid level. Vascular/Lymphatic: Aortic  atherosclerosis. No enlarged abdominal or pelvic lymph nodes. Reproductive: Uterus and bilateral adnexa are unremarkable. Other: No free fluid or free air. No body wall hematoma or focal abdominal wall hernia. There is some mild subcutaneous edema lateral to the left gluteal musculature likely related to edema from trauma. Musculoskeletal: Bilateral hip arthroplasties are present. L4-L5 posterior fusion hardware is present. No acute fractures are seen. IMPRESSION: 1. Multiple left-sided rib fractures involving the third through ninth ribs. Flail chest involving the seventh, eighth and ninth rib fractures. 2. Trace left hydropneumothorax. 3. Left lower lobe airspace disease worrisome for pneumonia or aspiration. There is plugging of left lower lobe bronchi. 4. No acute posttraumatic sequelae in the abdomen or pelvis. 5. Subcutaneous edema lateral to the left gluteal musculature likely related to trauma. 6. Stable cystic lesion in the tail the pancreas. Recommend follow-up with MRI in 2 years to re-evaluate. 7. Left thyroid nodule measuring 1.9 x 1.4 cm. Follow-up thyroid ultrasound recommended. 8. Colonic diverticulosis. 9. Bilateral renal cysts. Hyperdense lesion in the left kidney is favored as a complex cyst. This can be further evaluated with ultrasound or MRI if clinically warranted. Aortic Atherosclerosis (ICD10-I70.0). Electronically Signed   By: Darliss Cheney M.D.   On: 11/12/2022 23:50   DG Shoulder Left  Result Date: 11/12/2022 CLINICAL DATA:  Larey Seat, left shoulder pain EXAM: LEFT SHOULDER - 2+ VIEW COMPARISON:  09/14/2008 FINDINGS: Frontal and transscapular views of the left shoulder are obtained. Alignment is anatomic. There is a prior healed distal left clavicular fracture. Multiple left posterolateral rib fractures are seen involving the third through eighth ribs. Mild degenerative changes  of the glenohumeral and acromioclavicular joints. Soft tissues are unremarkable. IMPRESSION: 1. Multiple displaced  left posterolateral rib fractures, involving at least the third through eighth ribs. 2. No acute displaced shoulder fracture. 3. Mild acromioclavicular and glenohumeral joint osteoarthritis. Electronically Signed   By: Sharlet Salina M.D.   On: 11/12/2022 23:47    Anti-infectives (From admission, onward)    None        Assessment/Plan Mechanical fall 5/25 and 6/2 L 3-9 rib fxs, flail chest of 7-9 rib fxs - multimodal pain control with aggressive pulm toilet with IS. Supplemental O2 as needed and wean as able Trace L hydropneumothorax - not seen on CXR this am, treatment as above L gluteal hematoma - ice prn, multimodal pain control L shoulder pain - shoulder xray with arthritic changes. No acute fracture. Pain control type II DM - SSI HTN - home regimen ordered OSA on CPAP - CPAP QHS Bipolar affective disorder AKI vs CKD of indeterminate stage - Cr 1.92. Baseline 1.55 on 07/12/20. IVF. Repeat BMP in AM.  Incidental findings - Cystic lesion in the tail of the pancreas, left thyroid nodule, left renal lesion. Recommend outpatient follow up with PCP for further imaging as is appropriate  CT ch/abd/pelvis and shoulder xray completed on admission. Given fall with head trauma 5/25 w/ ongoing HA + nausea and patient cannot be r/o with [facility] or C-Spine or [facility] C-spine criteria will get [facility] and CT C-Spine to complete imaging work-up.   FEN - Reg diet if CTH and CT C-Spine are negative, NS @ 75 ml/hr ID - none needed VTE - Already on lovenox, SCDs Foley - none Dispo - admit to trauma service 6N, pain control, therapies  Leary Roca, [facility] 11/13/2022, 7:41 AM Please see Amion for pager number during day hours 7:00am-4:30pm

## 2022-11-13 NOTE — Progress Notes (Signed)
Care-link  transferred patient to the unit,patient was made comfortable in bed,skin assessment done,bed in low position and call light in reach,will continue to monitor.

## 2022-11-13 NOTE — Evaluation (Signed)
Speech Language Pathology Evaluation Patient Details Name: Yvonne Peterson MRN: [id number] DOB: [birth year] Today's Date: 11/13/2022 Time: 6962-9528 SLP Time Calculation (min) (ACUTE ONLY): 18 min  Problem List:  Patient Active Problem List   Diagnosis Date Noted   Multiple rib fractures 11/13/2022   Hyponatremia 09/10/2013   Unspecified essential hypertension 09/10/2013   Gait instability 09/10/2013   Type II or unspecified type diabetes mellitus without mention of complication, not stated as uncontrolled 09/10/2013   Bipolar affective disorder (HCC) 09/10/2013   Internal hemorrhoid, bleeding 07/16/2013   Anal skin tag 07/16/2013   OSA on CPAP 03/29/2012   Diabetes mellitus type II, controlled (HCC) 03/28/2012   Malignant hypertension 03/28/2012   Chest pain 03/28/2012   Cardiomegaly 03/28/2012   Obesity 03/28/2012   Past Medical History:  Past Medical History:  Diagnosis Date   Anemia    Anxiety    Arthritis    "hands; knees" (09/10/2013)   Bipolar affective (HCC)    Complication of anesthesia    panic attacks   Depression    Gait instability 09/06/2013   admitted 09/10/2013   GERD (gastroesophageal reflux disease)    History of blood transfusion    "S/P back OR"   Hypertension    Migraines    "years ago" (09/10/2013)   OSA on CPAP 03/29/2012   PONV (postoperative nausea and vomiting)    Type II diabetes mellitus (HCC)    Past Surgical History:  Past Surgical History:  Procedure Laterality Date   APPENDECTOMY  June 1967   BACK SURGERY     BLADDER SUSPENSION  1992   CARPAL TUNNEL RELEASE Right approx. 2000   CATARACT EXTRACTION W/ INTRAOCULAR LENS  IMPLANT, BILATERAL  2012-2013   left eye - Nov. 2012, right eye - Mar. 2013   CHOLECYSTECTOMY  ~ 1989   HEMORRHOID BANDING  08/2013   NASAL SINUS SURGERY  1978   OVARIAN CYST REMOVAL  1967   POSTERIOR LUMBAR FUSION  12/2012   TONSILLECTOMY  1955   TOTAL KNEE ARTHROPLASTY Bilateral 2010-2011   Left knee - Dec. 2010, right  kee - March 2011   HPI:  77 year old female with medical history significant for type II DM, hypertension, cardiomegaly, OSA on CPAP, bipolar, obesity who presented to [facility] ED 6/2 with left-sided thoracic pain, left shoulder pain, abdominal discomfort.  She had a mechanical fall 6/2 striking her left ribs before she slowly lowered herself to the ground, denied head trauma or LOC. She did report she had a mechanical fall on 5/25 as well and has been having HA and nausea since that time and told SLP she felt like she blacked out. Trauma PA ordered head CT and speech-language-cognitive eval.   Assessment / Plan / Recommendation Clinical Impression       SLP Assessment  SLP Recommendation/Assessment: Patient does not need any further Speech Lanaguage Pathology Services SLP Visit Diagnosis: Cognitive communication deficit (R41.841)    Recommendations for follow up therapy are one component of a multi-disciplinary discharge planning process, led by the attending physician.  Recommendations may be updated based on patient status, additional functional criteria and insurance authorization.    Follow Up Recommendations  No SLP follow up    Assistance Recommended at Discharge  None  Functional Status Assessment Patient has not had a recent decline in their functional status  Frequency and Duration           SLP Evaluation Cognition  Overall Cognitive Status: Within Functional Limits for tasks assessed  Arousal/Alertness: Awake/alert Orientation Level: Oriented X4 Year: 2024 Month: June Day of Week: Correct Attention: Sustained Sustained Attention: Appears intact Memory: Impaired (recalled 3/5 words) Memory Impairment: Retrieval deficit;Storage deficit Awareness: Appears intact Problem Solving: Appears intact Safety/Judgment: Appears intact       Comprehension  Auditory Comprehension Overall Auditory Comprehension: Appears within functional limits for tasks assessed Visual  Recognition/Discrimination Discrimination: Not tested Reading Comprehension Reading Status: Not tested    Expression Expression Primary Mode of Expression: Verbal Verbal Expression Overall Verbal Expression: Appears within functional limits for tasks assessed Pragmatics: No impairment Written Expression Dominant Hand: Right Written Expression: Not tested   Oral / Motor  Oral Motor/Sensory Function Overall Oral Motor/Sensory Function: Within functional limits Motor Speech Overall Motor Speech: Appears within functional limits for tasks assessed Motor Planning: Witnin functional limits            Royce Macadamia 11/13/2022, 2:57 PM

## 2022-11-13 NOTE — TOC CAGE-AID Note (Signed)
Transition of Care [facility]) - CAGE-AID Screening   Patient Details  Name: Yvonne Peterson MRN: [id number] Date of Birth: 1945/11/06  Transition of Care [facility]) CM/SW Contact:    Leota Sauers, RN Phone Number: 11/13/2022, 8:31 PM   Clinical Narrative:  Denies use of alcohol or illicit drugs. Education not offered at this time.  CAGE-AID Screening:    Have You Ever Felt You Ought to Cut Down on Your Drinking or Drug Use?: No Have People Annoyed You By Critizing Your Drinking Or Drug Use?: No Have You Felt Bad Or Guilty About Your Drinking Or Drug Use?: No Have You Ever Had a Drink or Used Drugs First Thing In The Morning to Steady Your Nerves or to Get Rid of a Hangover?: No CAGE-AID Score: 0  Substance Abuse Education Offered: No

## 2022-11-13 NOTE — Progress Notes (Signed)
PT Cancellation Note  Patient Details Name: Yvonne Peterson MRN: [id number] DOB: 10-Mar-1946   Cancelled Treatment:    Reason Eval/Treat Not Completed: Medical issues which prohibited therapy  Awaiting CT of head and c-spine per trauma notes. Will hold comprehensive physical therapy evaluation until safely cleared.  "Given fall with head trauma 5/25 w/ ongoing HA + nausea and patient cannot be r/o with [place] CTH or C-Spine or Nexus C-spine criteria "  Please secure chat [facility] PT if urgent evaluation needed.    Kathlyn Sacramento, PT, DPT Physical Therapist Acute Rehabilitation Services [facility] [phone]  Berton Mount 11/13/2022, 3:35 PM

## 2022-11-14 LAB — CBC
HCT: 32.8 % — ABNORMAL LOW (ref 36.0–46.0)
Hemoglobin: 10.5 g/dL — ABNORMAL LOW (ref 12.0–15.0)
MCH: 30.7 pg (ref 26.0–34.0)
MCHC: 32 g/dL (ref 30.0–36.0)
MCV: 95.9 fL (ref 80.0–100.0)
Platelets: 132 10*3/uL — ABNORMAL LOW (ref 150–400)
RBC: 3.42 MIL/uL — ABNORMAL LOW (ref 3.87–5.11)
RDW: 14.7 % (ref 11.5–15.5)
WBC: 6.9 10*3/uL (ref 4.0–10.5)
nRBC: 0 % (ref 0.0–0.2)

## 2022-11-14 LAB — BASIC METABOLIC PANEL
Anion gap: 7 (ref 5–15)
BUN: 28 mg/dL — ABNORMAL HIGH (ref 8–23)
CO2: 23 mmol/L (ref 22–32)
Calcium: 8.7 mg/dL — ABNORMAL LOW (ref 8.9–10.3)
Chloride: 106 mmol/L (ref 98–111)
Creatinine, Ser: 1.89 mg/dL — ABNORMAL HIGH (ref 0.44–1.00)
GFR, Estimated: 27 mL/min — ABNORMAL LOW (ref >60)
Glucose, Bld: 113 mg/dL — ABNORMAL HIGH (ref 70–99)
Potassium: 4.6 mmol/L (ref 3.5–5.1)
Sodium: 136 mmol/L (ref 135–145)

## 2022-11-14 LAB — GLUCOSE, CAPILLARY
Glucose-Capillary: 109 mg/dL — ABNORMAL HIGH (ref 70–99)
Glucose-Capillary: 119 mg/dL — ABNORMAL HIGH (ref 70–99)
Glucose-Capillary: 97 mg/dL (ref 70–99)
Glucose-Capillary: 121 mg/dL — ABNORMAL HIGH (ref 70–99)

## 2022-11-14 MED ORDER — MORPHINE SULFATE (PF) 2 MG/ML IV SOLN
1.0000 mg | INTRAVENOUS | Status: DC | PRN
  Administered 2022-11-15: 2 mg via INTRAVENOUS
  Filled 2022-11-14: qty 1

## 2022-11-14 MED ORDER — METHOCARBAMOL 750 MG PO TABS
750.0000 mg | ORAL_TABLET | Freq: Three times a day (TID) | ORAL | Status: DC
  Administered 2022-11-14 – 2022-11-15 (×6): 750 mg via ORAL
  Filled 2022-11-14 (×6): qty 1

## 2022-11-14 MED ORDER — OXYCODONE HCL 5 MG PO TABS
5.0000 mg | ORAL_TABLET | ORAL | Status: DC | PRN
  Administered 2022-11-14 (×2): 10 mg via ORAL
  Administered 2022-11-14: 5 mg via ORAL
  Administered 2022-11-15: 10 mg via ORAL
  Filled 2022-11-14: qty 2
  Filled 2022-11-14: qty 1
  Filled 2022-11-14 (×2): qty 2

## 2022-11-14 NOTE — Progress Notes (Signed)
Subjective: CC: CTH and CT C-Spine negative for acute injuries.   Having a lot of left rib pain, worse with movement. Requiring IV pain medications on top of po meds. No sob. Pulling 750 on IS. No other areas of pain. Tolerating cld without n/v. Passing flatus. Voiding. Oob to bathroom with assistance of staff + RW this am. No new areas of pain.    Afebrile. No tachycardia or hypotension. On 2L. Does not wear o2 at home. No tobacco use. Denies hx of COPD/Asthma. Wears CPAP at night.   Objective: Vital signs in last 24 hours: Temp:  [98.2 F (36.8 C)-98.4 F (36.9 C)] 98.3 F (36.8 C) (06/04 0437) Pulse Rate:  [50-60] 53 (06/04 0437) Resp:  [16-20] 19 (06/04 0437) BP: (109-144)/(54-77) 142/69 (06/04 0437) SpO2:  [95 %-100 %] 100 % (06/04 0437) Last BM Date : 11/12/22  Intake/Output from previous day: 06/03 0701 - 06/04 0700 In: 2865.8 [P.O.:1080; I.V.:1785.8] Out: -  Intake/Output this shift: No intake/output data recorded.  PE: Gen:  Alert, NAD, pleasant Card:  Reg Pulm:  CTAB, no W/R/R, effort normal. Weaned from 2L > 1L. Pulling 750 on IS.  Abd: Soft, ND, NT, +BS Ext:  No LE edema. MAE's Psych: A&Ox3   Lab Results:  Recent Labs    11/12/22 2237 11/14/22 0049  WBC 10.0 6.9  HGB 12.6 10.5*  HCT 39.4 32.8*  PLT 133* 132*   BMET Recent Labs    11/12/22 2237 11/14/22 0049  NA 136 136  K 4.8 4.6  CL 105 106  CO2 20* 23  GLUCOSE 134* 113*  BUN 27* 28*  CREATININE 1.92* 1.89*  CALCIUM 9.1 8.7*   PT/INR No results for input(s): "LABPROT", "INR" in the last 72 hours. CMP     Component Value Date/Time   NA 136 11/14/2022 0049   K 4.6 11/14/2022 0049   CL 106 11/14/2022 0049   CO2 23 11/14/2022 0049   GLUCOSE 113 (H) 11/14/2022 0049   BUN 28 (H) 11/14/2022 0049   CREATININE 1.89 (H) 11/14/2022 0049   CALCIUM 8.7 (L) 11/14/2022 0049   PROT 7.0 11/12/2022 2237   ALBUMIN 3.6 11/12/2022 2237   AST 35 11/12/2022 2237   ALT 25 11/12/2022 2237    ALKPHOS 44 11/12/2022 2237   BILITOT 1.3 (H) 11/12/2022 2237   GFRNONAA 27 (L) 11/14/2022 0049   GFRAA >90 09/12/2013 1252   Lipase     Component Value Date/Time   LIPASE 138 (H) 09/10/2013 1340    Studies/Results: CT HEAD WO CONTRAST ( )  Result Date: 11/13/2022 CLINICAL DATA:  Fall EXAM: CT HEAD WITHOUT CONTRAST TECHNIQUE: Contiguous axial images were obtained from the base of the skull through the vertex without intravenous contrast. RADIATION DOSE REDUCTION: This exam was performed according to the departmental dose-optimization program which includes automated exposure control, adjustment of the mA and/or kV according to patient size and/or use of iterative reconstruction technique. COMPARISON:  CT head 09/10/2013 FINDINGS: Brain: There is no acute intracranial hemorrhage, extra-axial fluid collection, or acute infarct. Parenchymal volume is within expected limits for age. The ventricles are normal in size. Patchy hypodensity in the supratentorial white matter likely reflects sequela of underlying chronic small-vessel ischemic change. The pituitary and suprasellar region are normal. There is no mass lesion. There is no mass effect or midline shift. Vascular: There is calcification of the bilateral carotid siphons. Skull: Normal. Negative for fracture or focal lesion. Sinuses/Orbits: There is layering fluid in the  sphenoid sinuses. Bilateral lens implants are in place. The globes and orbits are otherwise unremarkable. Other: None. IMPRESSION: 1. No acute intracranial pathology. 2. Layering fluid in the sphenoid sinuses which can be seen with acute sinusitis in the correct clinical setting. Electronically Signed   By: Lesia Hausen M.D.   On: 11/13/2022 17:15   CT CERVICAL SPINE WO CONTRAST  Result Date: 11/13/2022 CLINICAL DATA:  Neck trauma EXAM: CT CERVICAL SPINE WITHOUT CONTRAST TECHNIQUE: Multidetector CT imaging of the cervical spine was performed without intravenous contrast. Multiplanar  CT image reconstructions were also generated. RADIATION DOSE REDUCTION: This exam was performed according to the departmental dose-optimization program which includes automated exposure control, adjustment of the mA and/or kV according to patient size and/or use of iterative reconstruction technique. COMPARISON:  Cervical spine CT 09/14/2008, report only. FINDINGS: Alignment: There is 2 mm of anterolisthesis at C4-C5. Alignment is otherwise anatomic. Skull base and vertebrae: The bones are diffusely osteopenic. Vertebral body hemangiomas are seen at T2 and T3. There is no acute fracture. Soft tissues and spinal canal: No prevertebral fluid or swelling. No visible canal hematoma. Disc levels: There is disc space narrowing and endplate osteophyte formation throughout the cervical spine most significant at C5-C6 and C6-C7 compatible with degenerative change. There is bilateral facet arthropathy. There is no severe neural foraminal stenosis at any level. There is mild central canal stenosis at C5-C6 and C6-C7 secondary to posterior disc osteophyte complexes. Upper chest: Negative. Other: Posterior left thyroid nodule measures 17 mm. IMPRESSION: 1. No acute fracture or traumatic subluxation of the cervical spine. 2. Multilevel degenerative changes, most significant at C5-C6 and C6-C7. 3. Incidental left thyroid nodule measuring 1.7 cm. Recommend non-emergent thyroid ultrasound. Reference: J Am Coll Radiol. 2015 Feb;12(2): 143-50 Electronically Signed   By: Darliss Cheney M.D.   On: 11/13/2022 17:06   DG Chest Port 1 View  Result Date: 11/13/2022 CLINICAL DATA:  Pneumo thorax. EXAM: PORTABLE CHEST 1 VIEW COMPARISON:  Chest CT yesterday FINDINGS: The trace left pneumothorax on CT is not seen by radiograph. Left pleural thickening related to multiple left rib fractures. Mild retrocardiac opacity, no pleural effusion on CT is not well-defined. Cardiomegaly. The right lung is clear. IMPRESSION: 1. The known small left  pneumothorax on CT is not seen by radiograph. 2. Left pleural thickening related to multiple left rib fractures. Retrocardiac opacity as seen on CT. 3. Cardiomegaly. Electronically Signed   By: Narda Rutherford M.D.   On: 11/13/2022 02:17   CT CHEST ABDOMEN PELVIS WO CONTRAST  Result Date: 11/12/2022 CLINICAL DATA:  Chest trauma with fall onto the left ribs. EXAM: CT CHEST, ABDOMEN AND PELVIS WITHOUT CONTRAST TECHNIQUE: Multidetector CT imaging of the chest, abdomen and pelvis was performed following the standard protocol without IV contrast. RADIATION DOSE REDUCTION: This exam was performed according to the departmental dose-optimization program which includes automated exposure control, adjustment of the mA and/or kV according to patient size and/or use of iterative reconstruction technique. COMPARISON:  MRI abdomen 01/19/2022. CT abdomen and pelvis 01/20/2016. FINDINGS: CT CHEST FINDINGS Cardiovascular: Heart is mildly enlarged. There is no pericardial effusion. Aorta is normal in size. There are atherosclerotic calcifications of the aorta. Mediastinum/Nodes: There is a posterior left thyroid nodule measuring 1.9 by 1.4 cm. There are no enlarged mediastinal or hilar lymph nodes. The esophagus is within normal limits. There is no evidence for pneumomediastinum or mediastinal hematoma. Lungs/Pleura: There is left lower lobe airspace disease. There is some plugging of left lower lobe bronchi.  There is minimal right lower lobe airspace disease as well. There is trace left hydropneumothorax. Musculoskeletal: Acute left anterior third and fourth rib fractures are seen, minimally displaced. The left seventh, eighth and ninth rib fractures are fractured in 2 spots both laterally and posteriorly. There is an additional lateral left seventh rib fracture. Posterior rib fractures appear displaced. Intramuscular lipoma is present posterior to the right scapula measuring 2.3 x 2.6 x 4.9 cm. There is lower left chest wall  emphysema and intramuscular hematoma adjacent to rib fractures. CT ABDOMEN PELVIS FINDINGS Hepatobiliary: Gallbladder surgically absent. There is no biliary ductal dilatation. There is a right hepatic cyst measuring 2.5 cm. No other focal liver abnormality. Pancreas: No acute findings. Rounded 12 mm cystic lesion in the tail the pancreas again noted. Spleen: No splenic injury or perisplenic hematoma. Adrenals/Urinary Tract: No adrenal hemorrhage or renal injury identified. Bladder is unremarkable. Bilateral renal cysts are present measuring 8.4 cm on the right and 6.6 cm on the left. Rounded hyperdense area in the left kidney measures 9 mm favored as a complex cysts. Stomach/Bowel: Stomach is within normal limits. Appendix appears normal. No evidence of bowel wall thickening, distention, or inflammatory changes. There is no bowel obstruction, focal inflammation or free air. There is no pneumatosis. There is colonic diverticulosis. The appendix is not seen. Cecum is high riding and distended with air-fluid level. Vascular/Lymphatic: Aortic atherosclerosis. No enlarged abdominal or pelvic lymph nodes. Reproductive: Uterus and bilateral adnexa are unremarkable. Other: No free fluid or free air. No body wall hematoma or focal abdominal wall hernia. There is some mild subcutaneous edema lateral to the left gluteal musculature likely related to edema from trauma. Musculoskeletal: Bilateral hip arthroplasties are present. L4-L5 posterior fusion hardware is present. No acute fractures are seen. IMPRESSION: 1. Multiple left-sided rib fractures involving the third through ninth ribs. Flail chest involving the seventh, eighth and ninth rib fractures. 2. Trace left hydropneumothorax. 3. Left lower lobe airspace disease worrisome for pneumonia or aspiration. There is plugging of left lower lobe bronchi. 4. No acute posttraumatic sequelae in the abdomen or pelvis. 5. Subcutaneous edema lateral to the left gluteal musculature  likely related to trauma. 6. Stable cystic lesion in the tail the pancreas. Recommend follow-up with MRI in 2 years to re-evaluate. 7. Left thyroid nodule measuring 1.9 x 1.4 cm. Follow-up thyroid ultrasound recommended. 8. Colonic diverticulosis. 9. Bilateral renal cysts. Hyperdense lesion in the left kidney is favored as a complex cyst. This can be further evaluated with ultrasound or MRI if clinically warranted. Aortic Atherosclerosis (ICD10-I70.0). Electronically Signed   By: Darliss Cheney M.D.   On: 11/12/2022 23:50   DG Shoulder Left  Result Date: 11/12/2022 CLINICAL DATA:  Larey Seat, left shoulder pain EXAM: LEFT SHOULDER - 2+ VIEW COMPARISON:  09/14/2008 FINDINGS: Frontal and transscapular views of the left shoulder are obtained. Alignment is anatomic. There is a prior healed distal left clavicular fracture. Multiple left posterolateral rib fractures are seen involving the third through eighth ribs. Mild degenerative changes of the glenohumeral and acromioclavicular joints. Soft tissues are unremarkable. IMPRESSION: 1. Multiple displaced left posterolateral rib fractures, involving at least the third through eighth ribs. 2. No acute displaced shoulder fracture. 3. Mild acromioclavicular and glenohumeral joint osteoarthritis. Electronically Signed   By: Sharlet Salina M.D.   On: 11/12/2022 23:47    Anti-infectives: Anti-infectives (From admission, onward)    None        Assessment/Plan Mechanical fall 5/25 and 6/2 L 3-9 rib fxs, flail chest  of 7-9 rib fxs - multimodal pain control with aggressive pulm toilet with IS. Supplemental O2 as needed and wean as able Trace L hydropneumothorax - not seen on CXR 6/3, treatment as above L gluteal hematoma - ice prn, multimodal pain control. Hgb 12.6 > 10.5. Suspect at least partially dilutional. HDS w/o tachycardia or hypotension. Repeat cbc in am.  L shoulder pain - shoulder xray with arthritic changes. No acute fracture. Pain control type II DM -  SSI HTN - home regimen ordered OSA on CPAP - CPAP QHS Bipolar affective disorder AKI vs CKD of indeterminate stage - Cr 1.92 > 1.89. Baseline 1.55 on 07/12/20. Unclear if this is new baseline. Voiding ok. F/u PCP for repeat labs as outpatient. Will continue to trend  Incidental findings - Cystic lesion in the tail of the pancreas, left thyroid nodule, left renal lesion. Recommend outpatient follow up with PCP for further imaging as is appropriate FEN - Reg diet, SLIV ID - none needed VTE - Lovenox, SCDs Foley - none Dispo - Adjust pain meds. Therapies.   I reviewed nursing notes, last 24 h vitals and pain scores, last 48 h intake and output, last 24 h labs and trends, and last 24 h imaging results.   LOS: 1 day    Jacinto Halim , [facility] 11/14/2022, 7:32 AM Please see Amion for pager number during day hours 7:00am-4:30pm

## 2022-11-14 NOTE — Evaluation (Signed)
Physical Therapy Evaluation Patient Details Name: Yvonne Peterson MRN: [id number] DOB: Aug 05, 1945 Today's Date: 11/14/2022  History of Present Illness  Yvonne Peterson is a 77 y/o F presented to [facility] ED 6/2 with left-sided thoracic pain, left shoulder pain, abdominal discomfort. She had a mechanical fall 6/2 striking her left ribs before she slowly lowered herself to the ground, denied head trauma or LOC. She did report she had a mechanical fall on 5/25 as well and has been having HA and nausea. CT of head an C Spine negative for acute abnormalities. PMH includes type II DM, hypertension, cardiomegaly, OSA on CPAP, bipolar, obesity  Clinical Impression  PTA pt reports independence in mobility, ADL and iADLs, living in single story home with 1 step to enter. Pt was primary caregiver for her husband who died in 07/14/2022, so now she lives alone. Her goddaughter lives in [place], and her neighbors are supportive, unclear that the can provide the physical assist the pt needs at this time. Pt is currently limited by pain with movement including deep breathing. Pt is maxA for bed mobility, and min Ax2 for transfer to recliner. Pt refuses to attempt any further ambulation, however per notes has walked into bathroom with staff. Pt does not have level of care needed to safely return home alone. Patient will benefit from continued inpatient follow up therapy, <3 hours/day. Pt will likely refuse. Discussed possibility of 24/7 caregiver.      Recommendations for follow up therapy are one component of a multi-disciplinary discharge planning process, led by the attending physician.  Recommendations may be updated based on patient status, additional functional criteria and insurance authorization.  Follow Up Recommendations Can patient physically be transported by private vehicle: No     Assistance Recommended at Discharge Frequent or constant Supervision/Assistance  Patient can return home with the following  Two  people to help with walking and/or transfers;Two people to help with bathing/dressing/bathroom;Assistance with cooking/housework;Assistance with feeding;Direct supervision/assist for medications management;Assist for transportation;Help with stairs or ramp for entrance    Equipment Recommendations None recommended by PT     Functional Status Assessment Patient has had a recent decline in their functional status and demonstrates the ability to make significant improvements in function in a reasonable and predictable amount of time.     Precautions / Restrictions Precautions Precautions: Fall Precaution Comments: admitted as result of fall 2 falls in last 2 weeks results of LoB, Restrictions Weight Bearing Restrictions: No      Mobility  Bed Mobility Overal bed mobility: Needs Assistance Bed Mobility: Supine to Sit     Supine to sit: Max assist, HOB elevated     General bed mobility comments: Pt partially upright when therapy entered room, in order to progress hips to EOB and come fully EOB Pt required support using pillow on L side as well as assist with bed pad    Transfers Overall transfer level: Needs assistance Equipment used: 2 person hand held assist Transfers: Sit to/from Stand, Bed to chair/wheelchair/BSC Sit to Stand: Min assist, +2 physical assistance, +2 safety/equipment (dependent on support from bed on back of legs initially with 1 LOB that Pt self-corrected)   Step pivot transfers: Min assist, +2 physical assistance, +2 safety/equipment, From elevated surface       General transfer comment: increased time and effort for all aspects of transfers. Pt with initial posterior lean at baseline she requires extra time for transitions.    Ambulation/Gait  General Gait Details: unable        Balance Overall balance assessment: Needs assistance Sitting-balance support: Single extremity supported, Feet supported, Feet unsupported Sitting  balance-Leahy Scale: Fair Sitting balance - Comments: able to static sit unchallenged without LOB Postural control: Posterior lean (initially in standing) Standing balance support: Single extremity supported Standing balance-Leahy Scale: Poor Standing balance comment: initial LOB and posterior bias with standing                             Pertinent Vitals/Pain Pain Assessment Pain Assessment: Faces Faces Pain Scale: Hurts even more Pain Location: L ribs with movement Pain Descriptors / Indicators: Grimacing, Guarding, Moaning Pain Intervention(s): Limited activity within patient's tolerance, Monitored during session, Repositioned, Premedicated before session    Home Living Family/patient expects to be discharged to:: Private residence Living Arrangements: Alone Available Help at Discharge: Neighbor;Available PRN/intermittently Type of Home: House Home Access: Stairs to enter   Entrance Stairs-Number of Steps: 1   Home Layout: One level Home Equipment: Grab bars - tub/shower;Hand held shower head;Grab bars - toilet;Toilet riser;Shower seat;Cane - single point;Cane - quad;Rolling Walker (2 wheels) (pole, bidet)      Prior Function Prior Level of Function : Independent/Modified Independent;Driving;Working/employed (works as an Airline pilot, 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, 2022)                     Hand Dominance   Dominant Hand: Right    Extremity/Trunk Assessment   Upper Extremity Assessment LUE Deficits / Details: limited ROM due to pain - pt reporting pain more from ribs than shoulder LUE Coordination: decreased gross motor         Cervical / Trunk Assessment Cervical / Trunk Assessment: Kyphotic  Communication   Communication: No difficulties  Cognition Arousal/Alertness: Awake/alert Behavior During Therapy: WFL for tasks assessed/performed, Anxious Overall Cognitive Status: Impaired/Different from baseline Area of Impairment: Safety/judgement, Awareness,  Problem solving                         Safety/Judgement: Decreased awareness of safety, Decreased awareness of deficits Awareness: Emergent Problem Solving: Requires verbal cues, Decreased initiation General Comments: Pt stating she would get a scooter to get aroud her house in response to saying that she needed 24/7, also stating that her "son" was actually her dog initially. requires increased time for all actions and wants to think about things/decisions, anxious, required education and encouragement.        General Comments General comments (skin integrity, edema, etc.): Pt on RA throughout session and SpO2 >90% at rest and with activity. 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, no children - has a Scientist, forensic who works for [employer] in [place] that she implied would assist with decision making        Assessment/Plan    PT Assessment Patient needs continued PT services  PT Problem List Decreased strength;Decreased range of motion;Decreased activity tolerance;Decreased balance;Decreased mobility;Decreased safety awareness;Decreased cognition;Pain       PT Treatment Interventions DME instruction;Gait training;Functional mobility training;Stair training;Therapeutic activities;Therapeutic exercise;Balance training;Cognitive remediation;Patient/family education    PT Goals (Current goals can be found in the Care Plan section)  Acute Rehab PT Goals PT Goal Formulation: With patient Time For Goal Achievement: 11/28/22 Potential to Achieve Goals: Fair    Frequency Min 3X/week     Co-evaluation PT/OT/SLP Co-Evaluation/Treatment: Yes Reason for Co-Treatment: For patient/therapist safety PT goals addressed during session: Mobility/safety  with mobility         AM-PAC PT "6 Clicks" Mobility  Outcome Measure Help needed turning from your back to your side while in a flat bed without using bedrails?: A Lot Help needed moving from lying on your back to sitting on the side of  a flat bed without using bedrails?: Total Help needed moving to and from a bed to a chair (including a wheelchair)?: Total Help needed standing up from a chair using your arms (e.g., wheelchair or bedside chair)?: Total Help needed to walk in hospital room?: Total Help needed climbing 3-5 steps with a railing? : Total 6 Click Score: 7    End of Session Equipment Utilized During Treatment: Gait belt Activity Tolerance: Patient limited by pain Patient left: in chair;with call bell/phone within reach Nurse Communication: Mobility status PT Visit Diagnosis: Unsteadiness on feet (R26.81);Repeated falls (R29.6);Other abnormalities of gait and mobility (R26.89);Muscle weakness (generalized) (M62.81);History of falling (Z91.81);Difficulty in walking, not elsewhere classified (R26.2)    Time: 1610-9604 PT Time Calculation (min) (ACUTE ONLY): 30 min   Charges:   PT Evaluation $PT Eval Moderate Complexity: 1 Mod          Yulisa Chirico B. Beverely Risen PT, DPT Acute Rehabilitation Services Please use secure chat or  Call Office [phone]   [facility] 11/14/2022, 4:39 PM

## 2022-11-14 NOTE — Progress Notes (Signed)
   11/14/22 2205  BiPAP/CPAP/SIPAP  Reason BIPAP/CPAP not in use Other(comment) (pt. refused)

## 2022-11-14 NOTE — Evaluation (Signed)
Occupational Therapy Evaluation Patient Details Name: LEEZA GRALA MRN: [id number] DOB: 1946-02-12 Today's Date: 11/14/2022   History of Present Illness Audreyana Quale is a 77 y/o F presented to [facility] ED 6/2 with left-sided thoracic pain, left shoulder pain, abdominal discomfort. She had a mechanical fall 6/2 striking her left ribs before she slowly lowered herself to the ground, denied head trauma or LOC. She did report she had a mechanical fall on 5/25 as well and has been having HA and nausea. CT of head an C Spine negative for acute abnormalities. PMH includes type II DM, hypertension, cardiomegaly, OSA on CPAP, bipolar, obesity   Clinical Impression   Pt is typically independent in ADL and mobility, still working as Airline pilot and driving. Lives alone with her dog "Bertie". Today she presents with the deficits as listed below. She requires extra time and is limited by pain this session. Utilized pillow as support/brace on L side. Limited ROM of LUE due to pain in ribs (none reported in shoulder). At this time recommending post-acute rehab of <3 hours daily to maximize safety and independence in ADL and functional transfers. Next session plan on continued OOB with more functional assessment of LUE especially for use of grooming in context of standing tolerance.    Of note: Pt is not 100% on board for post-acute rehab. However right now she does not have 24/7 assist available at this time. Due to the amount of increased assist and concerns about cognition this is the safest and most reasonable discharge option.      Recommendations for follow up therapy are one component of a multi-disciplinary discharge planning process, led by the attending physician.  Recommendations may be updated based on patient status, additional functional criteria and insurance authorization.   Assistance Recommended at Discharge Frequent or constant Supervision/Assistance  Patient can return home with the following A  lot of help with walking and/or transfers;A little help with bathing/dressing/bathroom;Assistance with cooking/housework;Direct supervision/assist for medications management;Assist for transportation;Help with stairs or ramp for entrance    Functional Status Assessment  Patient has had a recent decline in their functional status and demonstrates the ability to make significant improvements in function in a reasonable and predictable amount of time.  Equipment Recommendations  None recommended by OT (Pt has appropriate DME)    Recommendations for Other Services PT consult     Precautions / Restrictions Precautions Precautions: Fall Precaution Comments: admitted as result of fall 2 falls in last 2 weeks results of LoB, Restrictions Weight Bearing Restrictions: No      Mobility Bed Mobility Overal bed mobility: Needs Assistance Bed Mobility: Supine to Sit     Supine to sit: Max assist, HOB elevated     General bed mobility comments: Pt partially upright when therapy entered room, in order to progress hips to EOB and come fully EOB Pt required support using pillow on L side as well as assist with bed pad    Transfers Overall transfer level: Needs assistance Equipment used: 2 person hand held assist Transfers: Sit to/from Stand, Bed to chair/wheelchair/BSC Sit to Stand: Min assist, +2 physical assistance, +2 safety/equipment (dependent on support from bed on back of legs initially with 1 LOB that Pt self-corrected)     Step pivot transfers: Min assist, +2 physical assistance, +2 safety/equipment, From elevated surface     General transfer comment: increased time and effort for all aspects of transfers. Pt with initial posterior lean at baseline she requires extra time for transitions.  Balance Overall balance assessment: Needs assistance Sitting-balance support: Single extremity supported, Feet supported, Feet unsupported Sitting balance-Leahy Scale: Fair Sitting  balance - Comments: able to static sit unchallenged without LOB Postural control: Posterior lean (initially in standing) Standing balance support: Single extremity supported Standing balance-Leahy Scale: Poor Standing balance comment: initial LOB and posterior bias with standing                           ADL either performed or assessed with clinical judgement   ADL Overall ADL's : Needs assistance/impaired Eating/Feeding: Set up;Bed level (HOB elevated)   Grooming: Wash/dry face;Set up;Sitting Grooming Details (indicate cue type and reason): will need more assist for grooming tasks requiring BUE or higher arm lift (brushing hair etc) Upper Body Bathing: Moderate assistance Upper Body Bathing Details (indicate cue type and reason): for back Lower Body Bathing: Maximal assistance Lower Body Bathing Details (indicate cue type and reason): knees down from seated position Upper Body Dressing : Moderate assistance Upper Body Dressing Details (indicate cue type and reason): for extra gown like robe Lower Body Dressing: Maximal assistance;Sit to/from stand Lower Body Dressing Details (indicate cue type and reason): unable to complete figure 4 Toilet Transfer: Minimal assistance;+2 for physical assistance;+2 for safety/equipment;Cueing for sequencing Toilet Transfer Details (indicate cue type and reason): min A +2 for initial boost from bed, Pt with pillow on L side for support/bracing Toileting- Clothing Manipulation and Hygiene: Maximal assistance       Functional mobility during ADLs: Minimal assistance;+2 for physical assistance;+2 for safety/equipment;Cueing for safety;Cueing for sequencing (declined use of RW) General ADL Comments: decreased access to LB, pain on L ribs impacting LUE use for functional tasks, decreased cognition and overall independence     Vision Baseline Vision/History: 1 Wears glasses (reading only) Ability to See in Adequate Light: 0 Adequate Patient  Visual Report: No change from baseline Vision Assessment?: No apparent visual deficits     Perception     Praxis      Pertinent Vitals/Pain       Hand Dominance Right   Extremity/Trunk Assessment Upper Extremity Assessment Upper Extremity Assessment: LUE deficits/detail LUE Deficits / Details: limited ROM due to pain - pt reporting pain more from ribs than shoulder LUE Coordination: decreased gross motor   Lower Extremity Assessment Lower Extremity Assessment: Defer to PT evaluation   Cervical / Trunk Assessment Cervical / Trunk Assessment: Kyphotic   Communication Communication Communication: No difficulties   Cognition Arousal/Alertness: Awake/alert Behavior During Therapy: WFL for tasks assessed/performed, Anxious Overall Cognitive Status: Impaired/Different from baseline Area of Impairment: Safety/judgement, Awareness, Problem solving                         Safety/Judgement: Decreased awareness of safety, Decreased awareness of deficits Awareness: Emergent Problem Solving: Requires verbal cues, Decreased initiation General Comments: Pt stating she would get a scooter to get aroud her house in response to saying that she needed 24/7, also stating that her "son" was actually her dog initially. requires increased time for all actions and wants to think about things/decisions, anxious, required education and encouragement.     General Comments  Pt on RA throughout session and SpO2 >90% at rest and with activity. 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, no children - has a Scientist, forensic who works for [employer] in [place] that she implied would assist with decision making    Exercises     Shoulder Instructions  Home Living Family/patient expects to be discharged to:: Private residence Living Arrangements: Alone Available Help at Discharge: Neighbor;Available PRN/intermittently Type of Home: House Home Access: Stairs to enter Entrance Stairs-Number of Steps:  1   Home Layout: One level     Bathroom Shower/Tub: Walk-in shower;Tub/shower unit   Bathroom Toilet: Handicapped height Bathroom Accessibility: Yes   Home Equipment: Grab bars - tub/shower;Hand held shower head;Grab bars - toilet;Toilet riser;Shower seat;Cane - single point;Cane - Programmer, applications (2 wheels) (pole, bidet)      Lives With: Other (Comment) (Berti - the dog)    Prior Functioning/Environment Prior Level of Function : Independent/Modified Independent;Driving;Working/employed (works as an Airline pilot, 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)                        OT Problem List: Decreased range of motion;Decreased activity tolerance;Impaired balance (sitting and/or standing);Decreased cognition;Decreased safety awareness;Decreased knowledge of use of DME or AE;Decreased knowledge of precautions;Obesity;Impaired UE functional use;Pain      OT Treatment/Interventions: Self-care/ADL training;Energy conservation;DME and/or AE instruction;Manual therapy;Therapeutic activities;Cognitive remediation/compensation;Patient/family education;Balance training    OT Goals(Current goals can be found in the care plan section) Acute Rehab OT Goals Patient Stated Goal: get home to dog OT Goal Formulation: With patient Time For Goal Achievement: 11/28/22 Potential to Achieve Goals: Good ADL Goals Pt Will Perform Grooming: with supervision;sitting Pt Will Perform Upper Body Dressing: with set-up;sitting Pt Will Perform Lower Body Dressing: with min guard assist;sit to/from stand Pt Will Transfer to Toilet: with supervision;ambulating Pt Will Perform Toileting - Clothing Manipulation and hygiene: with supervision;sitting/lateral leans Additional ADL Goal #1: Pt will perform bed mobility prior to engaging in ADL at supervision level  OT Frequency: Min 2X/week    Co-evaluation              AM-PAC OT "6 Clicks" Daily Activity     Outcome Measure Help from another person eating  meals?: A Little Help from another person taking care of personal grooming?: A Little Help from another person toileting, which includes using toliet, bedpan, or urinal?: A Lot Help from another person bathing (including washing, rinsing, drying)?: A Lot Help from another person to put on and taking off regular upper body clothing?: A Little Help from another person to put on and taking off regular lower body clothing?: A Lot 6 Click Score: 15   End of Session Equipment Utilized During Treatment: Gait belt Nurse Communication: Mobility status;Precautions;Weight bearing status  Activity Tolerance: Patient limited by pain;Other (comment) (Pt requires increased time for sessions) Patient left: in chair;with call bell/phone within reach;with chair alarm set  OT Visit Diagnosis: Unsteadiness on feet (R26.81);Muscle weakness (generalized) (M62.81);Pain;Other symptoms and signs involving cognitive function;History of falling (Z91.81) Pain - Right/Left: Left Pain - part of body:  (ribs)                Time: 1610-9604 OT Time Calculation (min): 54 min Charges:  OT General Charges $OT Visit: 1 Visit OT Evaluation $OT Eval Moderate Complexity: 1 Mod OT Treatments $Self Care/Home Management : 8-22 mins  Nyoka Cowden OTR/L Acute Rehabilitation Services Office: [phone]  Evern Bio [facility] 11/14/2022, 1:16 PM

## 2022-11-15 LAB — GLUCOSE, CAPILLARY
Glucose-Capillary: 124 mg/dL — ABNORMAL HIGH (ref 70–99)
Glucose-Capillary: 127 mg/dL — ABNORMAL HIGH (ref 70–99)
Glucose-Capillary: 123 mg/dL — ABNORMAL HIGH (ref 70–99)
Glucose-Capillary: 101 mg/dL — ABNORMAL HIGH (ref 70–99)

## 2022-11-15 LAB — BASIC METABOLIC PANEL
Anion gap: 8 (ref 5–15)
BUN: 28 mg/dL — ABNORMAL HIGH (ref 8–23)
CO2: 21 mmol/L — ABNORMAL LOW (ref 22–32)
Calcium: 8.8 mg/dL — ABNORMAL LOW (ref 8.9–10.3)
Chloride: 107 mmol/L (ref 98–111)
Creatinine, Ser: 1.89 mg/dL — ABNORMAL HIGH (ref 0.44–1.00)
GFR, Estimated: 27 mL/min — ABNORMAL LOW (ref >60)
Glucose, Bld: 106 mg/dL — ABNORMAL HIGH (ref 70–99)
Potassium: 4.8 mmol/L (ref 3.5–5.1)
Sodium: 136 mmol/L (ref 135–145)

## 2022-11-15 LAB — CBC
HCT: 35.5 % — ABNORMAL LOW (ref 36.0–46.0)
Hemoglobin: 11.2 g/dL — ABNORMAL LOW (ref 12.0–15.0)
MCH: 31.1 pg (ref 26.0–34.0)
MCHC: 31.5 g/dL (ref 30.0–36.0)
MCV: 98.6 fL (ref 80.0–100.0)
Platelets: 141 10*3/uL — ABNORMAL LOW (ref 150–400)
RBC: 3.6 MIL/uL — ABNORMAL LOW (ref 3.87–5.11)
RDW: 14.7 % (ref 11.5–15.5)
WBC: 7.9 10*3/uL (ref 4.0–10.5)
nRBC: 0 % (ref 0.0–0.2)

## 2022-11-15 LAB — HEMOGLOBIN A1C: Hgb A1c MFr Bld: UNDETERMINED % (ref 4.8–5.6)

## 2022-11-15 MED ORDER — POLYETHYLENE GLYCOL 3350 17 G PO PACK
17.0000 g | PACK | Freq: Every day | ORAL | Status: DC
  Administered 2022-11-15: 17 g via ORAL
  Filled 2022-11-15: qty 1

## 2022-11-15 NOTE — Plan of Care (Signed)

## 2022-11-15 NOTE — Progress Notes (Signed)
Subjective: CC: Stable left rib pain, worse with movement. No sob. Pulling 750 on IS. No other areas of pain. Tolerating po without n/v. Passing flatus. Voiding.   PT/OT rec SNF. Agreeable.   Afebrile. No tachycardia or hypotension. On 2L.   Objective: Vital signs in last 24 hours: Temp:  [98.1 F (36.7 C)-99 F (37.2 C)] 98.6 F (37 C) (06/05 0750) Pulse Rate:  [60-63] 61 (06/05 0750) Resp:  [16-21] 16 (06/05 0750) BP: (144-183)/(61-85) 163/84 (06/05 0750) SpO2:  [93 %-99 %] 95 % (06/05 0750) Last BM Date : 11/14/22  Intake/Output from previous day: No intake/output data recorded. Intake/Output this shift: No intake/output data recorded.  PE: Gen:  Alert, NAD, pleasant Card:  Reg Pulm:  CTAB, no W/R/R, effort normal. On o2. Pulling 750 on IS.  Abd: Soft, ND, NT, +BS Ext:  No LE edema. MAE's Psych: A&Ox3   Lab Results:  Recent Labs    11/14/22 0049 11/15/22 0044  WBC 6.9 7.9  HGB 10.5* 11.2*  HCT 32.8* 35.5*  PLT 132* 141*    BMET Recent Labs    11/14/22 0049 11/15/22 0044  NA 136 136  K 4.6 4.8  CL 106 107  CO2 23 21*  GLUCOSE 113* 106*  BUN 28* 28*  CREATININE 1.89* 1.89*  CALCIUM 8.7* 8.8*    PT/INR No results for input(s): "LABPROT", "INR" in the last 72 hours. CMP     Component Value Date/Time   NA 136 11/15/2022 0044   K 4.8 11/15/2022 0044   CL 107 11/15/2022 0044   CO2 21 (L) 11/15/2022 0044   GLUCOSE 106 (H) 11/15/2022 0044   BUN 28 (H) 11/15/2022 0044   CREATININE 1.89 (H) 11/15/2022 0044   CALCIUM 8.8 (L) 11/15/2022 0044   PROT 7.0 11/12/2022 2237   ALBUMIN 3.6 11/12/2022 2237   AST 35 11/12/2022 2237   ALT 25 11/12/2022 2237   ALKPHOS 44 11/12/2022 2237   BILITOT 1.3 (H) 11/12/2022 2237   GFRNONAA 27 (L) 11/15/2022 0044   GFRAA >90 09/12/2013 1252   Lipase     Component Value Date/Time   LIPASE 138 (H) 09/10/2013 1340    Studies/Results: CT HEAD WO CONTRAST ( )  Result Date: 11/13/2022 CLINICAL DATA:   Fall EXAM: CT HEAD WITHOUT CONTRAST TECHNIQUE: Contiguous axial images were obtained from the base of the skull through the vertex without intravenous contrast. RADIATION DOSE REDUCTION: This exam was performed according to the departmental dose-optimization program which includes automated exposure control, adjustment of the mA and/or kV according to patient size and/or use of iterative reconstruction technique. COMPARISON:  CT head 09/10/2013 FINDINGS: Brain: There is no acute intracranial hemorrhage, extra-axial fluid collection, or acute infarct. Parenchymal volume is within expected limits for age. The ventricles are normal in size. Patchy hypodensity in the supratentorial white matter likely reflects sequela of underlying chronic small-vessel ischemic change. The pituitary and suprasellar region are normal. There is no mass lesion. There is no mass effect or midline shift. Vascular: There is calcification of the bilateral carotid siphons. Skull: Normal. Negative for fracture or focal lesion. Sinuses/Orbits: There is layering fluid in the sphenoid sinuses. Bilateral lens implants are in place. The globes and orbits are otherwise unremarkable. Other: None. IMPRESSION: 1. No acute intracranial pathology. 2. Layering fluid in the sphenoid sinuses which can be seen with acute sinusitis in the correct clinical setting. Electronically Signed   By: Lesia Hausen M.D.   On: 11/13/2022 17:15  CT CERVICAL SPINE WO CONTRAST  Result Date: 11/13/2022 CLINICAL DATA:  Neck trauma EXAM: CT CERVICAL SPINE WITHOUT CONTRAST TECHNIQUE: Multidetector CT imaging of the cervical spine was performed without intravenous contrast. Multiplanar CT image reconstructions were also generated. RADIATION DOSE REDUCTION: This exam was performed according to the departmental dose-optimization program which includes automated exposure control, adjustment of the mA and/or kV according to patient size and/or use of iterative reconstruction  technique. COMPARISON:  Cervical spine CT 09/14/2008, report only. FINDINGS: Alignment: There is 2 mm of anterolisthesis at C4-C5. Alignment is otherwise anatomic. Skull base and vertebrae: The bones are diffusely osteopenic. Vertebral body hemangiomas are seen at T2 and T3. There is no acute fracture. Soft tissues and spinal canal: No prevertebral fluid or swelling. No visible canal hematoma. Disc levels: There is disc space narrowing and endplate osteophyte formation throughout the cervical spine most significant at C5-C6 and C6-C7 compatible with degenerative change. There is bilateral facet arthropathy. There is no severe neural foraminal stenosis at any level. There is mild central canal stenosis at C5-C6 and C6-C7 secondary to posterior disc osteophyte complexes. Upper chest: Negative. Other: Posterior left thyroid nodule measures 17 mm. IMPRESSION: 1. No acute fracture or traumatic subluxation of the cervical spine. 2. Multilevel degenerative changes, most significant at C5-C6 and C6-C7. 3. Incidental left thyroid nodule measuring 1.7 cm. Recommend non-emergent thyroid ultrasound. Reference: J Am Coll Radiol. 2015 Feb;12(2): 143-50 Electronically Signed   By: Darliss Cheney M.D.   On: 11/13/2022 17:06    Anti-infectives: Anti-infectives (From admission, onward)    None        Assessment/Plan Mechanical fall 5/25 and 6/2 L 3-9 rib fxs, flail chest of 7-9 rib fxs - multimodal pain control with aggressive pulm toilet with IS. Supplemental O2 as needed and wean as able Trace L hydropneumothorax - not seen on CXR 6/3, treatment as above L gluteal hematoma - ice prn, multimodal pain control. Hgb stable.  L shoulder pain - shoulder xray with arthritic changes. No acute fracture. Pain control type II DM - SSI HTN - home regimen ordered OSA on CPAP - CPAP QHS Bipolar affective disorder AKI vs CKD of indeterminate stage - Cr 1.92 > 1.89 > 1.89. Baseline 1.55 on 07/12/20. Question if this is new  baseline as now stable. Voiding ok. F/u PCP for repeat labs as outpatient.  Incidental findings - Cystic lesion in the tail of the pancreas, left thyroid nodule, left renal lesion. Recommend outpatient follow up with PCP for further imaging as is appropriate FEN - Reg diet, SLIV, bowel regimen.  ID - none needed VTE - Lovenox, SCDs Foley - none Dispo - Adjust pain meds. Therapies. SNF  I reviewed nursing notes, last 24 h vitals and pain scores, last 48 h intake and output, last 24 h labs and trends, and last 24 h imaging results.   LOS: 2 days    Jacinto Halim , [facility] 11/15/2022, 8:31 AM Please see Amion for pager number during day hours 7:00am-4:30pm

## 2022-11-15 NOTE — TOC Initial Note (Signed)
Transition of Care [facility]) - Initial/Assessment Note    Patient Details  Name: Yvonne Peterson MRN: [id number] Date of Birth: Aug 27, 1945  Transition of Care [facility]) CM/SW Contact:    Glennon Mac, RN Phone Number: 11/15/2022, 1:00 PM  Clinical Narrative:                 Yvonne Peterson is a 77 y/o F presented to [facility] ED 6/2 with left-sided thoracic pain, left shoulder pain, abdominal discomfort. She had a mechanical fall 6/2 striking her left ribs before she slowly lowered herself to the ground, denied head trauma or LOC. She did report she had a mechanical fall on 5/25 as well and has been having HA and nausea. CT of head an C Spine negative for acute abnormalities.  PTA, pt independent and lives alone; her husband passed away in Jul 14, 2022.  PT/OT recommending SNF for short term rehab, and patient agreeable, though wants to return home as soon as she can.  Will initiate FL2 and fax out for bed search.   Expected Discharge Plan: Skilled Nursing Facility Barriers to Discharge: SNF Pending bed offer            Expected Discharge Plan and Services   Discharge Planning Services: CM Consult Post Acute Care Choice: Skilled Nursing Facility Living arrangements for the past 2 months: Single Family Home                                      Prior Living Arrangements/Services Living arrangements for the past 2 months: Single Family Home Lives with:: Self Patient language and need for interpreter reviewed:: Yes Do you feel safe going back to the place where you live?: Yes      Need for Family Participation in Patient Care: Yes (Comment) Care giver support system in place?: No (comment)   Criminal Activity/Legal Involvement Pertinent to Current Situation/Hospitalization: No - Comment as needed               Emotional Assessment Appearance:: Appears stated age Attitude/Demeanor/Rapport: Engaged Affect (typically observed): Accepting Orientation: : Oriented to Self, Oriented  to Place, Oriented to  Time, Oriented to Situation      Admission diagnosis:  Thyroid nodule [E04.1] 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 cyst [N28.1] AKI (acute kidney injury) (HCC) [N17.9] Multiple rib fractures [S22.49XA] Closed fracture of multiple ribs of left side, initial encounter [S22.42XA] Patient Active Problem List   Diagnosis Date Noted   Multiple rib fractures 11/13/2022   Hyponatremia 09/10/2013   Unspecified essential hypertension 09/10/2013   Gait instability 09/10/2013   Type II or unspecified type diabetes mellitus without mention of complication, not stated as uncontrolled 09/10/2013   Bipolar affective disorder (HCC) 09/10/2013   Internal hemorrhoid, bleeding 07/16/2013   Anal skin tag 07/16/2013   OSA on CPAP 03/29/2012   Diabetes mellitus type II, controlled (HCC) 03/28/2012   Malignant hypertension 03/28/2012   Chest pain 03/28/2012   Cardiomegaly 03/28/2012   Obesity 03/28/2012   PCP:  Merri Brunette, MD Pharmacy:   [id number] - [place], [place] - [address] [place] [address] Phone: [phone] Fax: [phone]  [facility] - [place], [place] - [address] [place] [address] Phone: [phone] Fax: [phone]     Social Determinants of Health (SDOH) Social History: SDOH Screenings   Tobacco Use: Low Risk  (11/13/2022)  SDOH Interventions:     Readmission Risk Interventions     No data to display         Quintella Baton, RN, BSN  Trauma/Neuro ICU Case Manager [phone]

## 2022-11-15 NOTE — Progress Notes (Signed)
Pt refused bipap/cpap at this time. 

## 2022-11-15 NOTE — Progress Notes (Signed)
Mobility Specialist Progress Note:    11/15/22 1500  Mobility  Activity Ambulated with assistance in hallway  Level of Assistance Contact guard assist, steadying assist  Assistive Device Front wheel walker  Distance Ambulated (ft) 150 ft  Activity Response Tolerated well  Mobility Referral Yes  $Mobility charge 1 Mobility  Mobility Specialist Start Time (ACUTE ONLY) 1515  Mobility Specialist Stop Time (ACUTE ONLY) 1545  Mobility Specialist Time Calculation (min) (ACUTE ONLY) 30 min   Pt received in bed, hesitant but agreeable to ambulate. Standby assist for bed mobility and contact guard during ambulation. Pt c/o L side pain, no rating given. Pt assisted back to bed w/ call bell and personal belongings in hand.  Thompson Grayer Mobility Specialist  Please contact vis Secure Chat or  Rehab Office [phone]

## 2022-11-15 NOTE — [place] FL2 (Signed)
[place] MEDICAID FL2 LEVEL OF CARE FORM     IDENTIFICATION  Patient Name: Yvonne Peterson Birthdate: December 19, 1945 Sex: female Admission Date (Current Location): 11/12/2022  [facility] and [place] Number:  Producer, television/film/video and Address:  [facility] [place], [address] [place], [place] [address]      Provider Number: [id number]  Attending Physician Name and Address:  Md, Trauma, MD  Relative Name and Phone Number:       Current Level of Care: SNF Recommended Level of Care: Skilled Nursing Facility Prior Approval Number:    Date Approved/Denied:   PASRR Number: [id number]  Discharge Plan: SNF    Current Diagnoses: Patient Active Problem List   Diagnosis Date Noted   Multiple rib fractures 11/13/2022   Hyponatremia 09/10/2013   Unspecified essential hypertension 09/10/2013   Gait instability 09/10/2013   Type II or unspecified type diabetes mellitus without mention of complication, not stated as uncontrolled 09/10/2013   Bipolar affective disorder (HCC) 09/10/2013   Internal hemorrhoid, bleeding 07/16/2013   Anal skin tag 07/16/2013   OSA on CPAP 03/29/2012   Diabetes mellitus type II, controlled (HCC) 03/28/2012   Malignant hypertension 03/28/2012   Chest pain 03/28/2012   Cardiomegaly 03/28/2012   Obesity 03/28/2012    Orientation RESPIRATION BLADDER Height & Weight     Self, Time, Situation, Place  O2 (2L nasal cannula) Continent Weight: 196 lb (88.9 kg) Height:  5\' 4"  (162.6 cm)  BEHAVIORAL SYMPTOMS/MOOD NEUROLOGICAL BOWEL NUTRITION STATUS      Continent Diet (see d/c summary)  AMBULATORY STATUS COMMUNICATION OF NEEDS Skin   Extensive Assist Verbally                         Personal Care Assistance Level of Assistance  Bathing, Feeding, Dressing Bathing Assistance: Limited assistance Feeding assistance: Independent Dressing Assistance: Limited assistance     Functional Limitations Info  Sight, Hearing, Speech Sight Info:  Adequate Hearing Info: Adequate Speech Info: Adequate    SPECIAL CARE FACTORS FREQUENCY  PT (By licensed PT), OT (By licensed OT)     PT Frequency: 5x/week OT Frequency: 5x/week            Contractures Contractures Info: Not present    Additional Factors Info  Code Status, Allergies Code Status Info: full code Allergies Info: Sulfa Antibiotics, gabapentin           Current Medications (11/15/2022):  This is the current hospital active medication list Current Facility-Administered Medications  Medication Dose Route Frequency Provider Last Rate Last Admin   acetaminophen (TYLENOL) tablet 1,000 mg  1,000 mg Oral Q6H Gaynelle Adu, MD   1,000 mg at 11/15/22 1244   amLODipine (NORVASC) tablet 5 mg  5 mg Oral Daily Gaynelle Adu, MD   5 mg at 11/15/22 1004   docusate sodium (COLACE) capsule 100 mg  100 mg Oral BID Gaynelle Adu, MD   100 mg at 11/15/22 1004   enoxaparin (LOVENOX) injection 30 mg  30 mg Subcutaneous Q24H Gaynelle Adu, MD   30 mg at 11/15/22 1003   hydrALAZINE (APRESOLINE) injection 10 mg  10 mg Intravenous Q2H PRN Gaynelle Adu, MD   10 mg at 11/13/22 0336   insulin aspart (novoLOG) injection 0-9 Units  0-9 Units Subcutaneous TID WC Gaynelle Adu, MD   1 Units at 11/15/22 1244   losartan (COZAAR) tablet 100 mg  100 mg Oral Daily Jacinto Halim, PA-C   100 mg  at 11/15/22 1004   methocarbamol (ROBAXIN) tablet 750 mg  750 mg Oral TID Jacinto Halim, PA-C   750 mg at 11/15/22 1004   metoprolol tartrate (LOPRESSOR) injection 5 mg  5 mg Intravenous Q6H PRN Gaynelle Adu, MD       morphine (PF) 2 MG/ML injection 1-2 mg  1-2 mg Intravenous Q3H PRN Jacinto Halim, PA-C   2 mg at 11/15/22 0025   ondansetron (ZOFRAN-ODT) disintegrating tablet 4 mg  4 mg Oral Q6H PRN Gaynelle Adu, MD       Or   ondansetron [facility]) injection 4 mg  4 mg Intravenous Q6H PRN Gaynelle Adu, MD   4 mg at 11/15/22 1610   oxyCODONE (Oxy IR/ROXICODONE) immediate release tablet 5-10 mg  5-10 mg Oral  Q4H PRN Jacinto Halim, PA-C   10 mg at 11/15/22 1004   phenol (CHLORASEPTIC) mouth spray 1 spray  1 spray Mouth/Throat PRN Jacinto Halim, PA-C   1 spray at 11/14/22 0545   polyethylene glycol (MIRALAX / GLYCOLAX) packet 17 g  17 g Oral Daily Jacinto Halim, PA-C   17 g at 11/15/22 1003     Discharge Medications: Please see discharge summary for a list of discharge medications.  Relevant Imaging Results:  Relevant Lab Results:   Additional Information SSN [id number] [place], [place]

## 2022-11-15 NOTE — Progress Notes (Signed)
Occupational Therapy Treatment Patient Details Name: Yvonne Peterson MRN: [id number] DOB: 01-Sep-1945 Today's Date: 11/15/2022   History of present illness Yvonne Peterson is a 77 y/o F presented to [facility] ED 6/2 with left-sided thoracic pain, left shoulder pain, abdominal discomfort. She had a mechanical fall 6/2 striking her left ribs before she slowly lowered herself to the ground, denied head trauma or LOC. She did report she had a mechanical fall on 5/25 as well and has been having HA and nausea. CT of head an C Spine negative for acute abnormalities. PMH includes type II DM, hypertension, cardiomegaly, OSA on CPAP, bipolar, obesity (L rib fxs 3-9)   OT comments  Patient sitting in bedside chair upon arrival into room.  Patient reported that she tried to stand with out atff member in room and chair alarm went off and she returned to seated position.  Patient educate don importance of not getting up without staff member to prevent another fall.  Patient asked about receiving a back brace to support fractured ribs, patient educated that back braces are not used to support  fx ribs (patient found this info on [facility] per patient report).  Patient educated no not restricting lung expansion and back brace would more than likely irritate rib fx due to compressive nature of back brace.  Patient verbalized understanding of education.  Patient continues to guard L UE and will not complete AROM fo LUE at this time with patient being educated on importance of completing ROM to prevent stiffness and weakness.  Patient completed sit to stand with min guard and increased time and completed functional mobility with RW to R side of bed and completed sit to supine using reverse log roll into bed with max A while holding onto pillow on L side.  Patient would benefit from further therapy to address overall safety and continued deficits.   Recommendations for follow up therapy are one component of a multi-disciplinary  discharge planning process, led by the attending physician.  Recommendations may be updated based on patient status, additional functional criteria and insurance authorization.    Assistance Recommended at Discharge Frequent or constant Supervision/Assistance  Patient can return home with the following  A lot of help with walking and/or transfers;A little help with bathing/dressing/bathroom;Assistance with cooking/housework;Direct supervision/assist for medications management;Assist for transportation;Help with stairs or ramp for entrance   Equipment Recommendations  None recommended by OT    Recommendations for Other Services      Precautions / Restrictions Precautions Precautions: Fall Precaution Comments: admitted as result of fall 2 falls in last 2 weeks results of LoB, Restrictions Weight Bearing Restrictions: No       Mobility Bed Mobility Overal bed mobility: Needs Assistance Bed Mobility: Sit to Supine       Sit to supine: Max assist, HOB elevated (to lift LEs into bed)        Transfers Overall transfer level: Needs assistance Equipment used: Rolling walker (2 wheels) Transfers: Sit to/from Stand, Bed to chair/wheelchair/BSC Sit to Stand: Min assist     Step pivot transfers: Min assist           Balance                                           ADL either performed or assessed with clinical judgement   ADL Overall ADL's : Needs assistance/impaired Eating/Feeding:  Set up;Bed level   Grooming: Wash/dry face;Set up;Sitting                               Functional mobility during ADLs: Minimal assistance;Cueing for sequencing;Cueing for safety;Rollator (4 wheels)      Extremity/Trunk Assessment Upper Extremity Assessment Upper Extremity Assessment: Generalized weakness;LUE deficits/detail (patient guarding L UE and not wanting to functional use 2/2 L rib pain) LUE Deficits / Details: limited ROM due to pain - pt  reporting pain more from ribs than shoulder LUE: Unable to fully assess due to pain LUE Sensation: WNL LUE Coordination: decreased gross motor            Vision   Vision Assessment?: No apparent visual deficits   Perception     Praxis      Cognition Arousal/Alertness: Awake/alert Behavior During Therapy: WFL for tasks assessed/performed, Anxious Overall Cognitive Status: Impaired/Different from baseline Area of Impairment: Safety/judgement, Awareness, Problem solving                         Safety/Judgement: Decreased awareness of safety     General Comments: patient stating that she is going to use her back brace to help support her ribs.  Patient educated that back brace would more than likely irritate L rib fx and not benefit in supporting ribs like she is thinking.  Patient reports that she got information from [facility] that its beneficial to brace ribs.  Patient also educated on not retricting full expansion of lungs to reduce risk of PNA.        Exercises      Shoulder Instructions       General Comments      Pertinent Vitals/ Pain       Pain Assessment Pain Assessment: 0-10 Pain Score: 7  Pain Location: L ribs with movement Pain Descriptors / Indicators: Grimacing, Guarding, Moaning Pain Intervention(s): Repositioned  Home Living                                          Prior Functioning/Environment              Frequency  Min 2X/week        Progress Toward Goals  OT Goals(current goals can now be found in the care plan section)  Progress towards OT goals: Progressing toward goals  Acute Rehab OT Goals OT Goal Formulation: With patient Time For Goal Achievement: 11/28/22 Potential to Achieve Goals: Good ADL Goals Pt Will Perform Grooming: with supervision;sitting Pt Will Perform Upper Body Dressing: with set-up;sitting Pt Will Perform Lower Body Dressing: with min guard assist;sit to/from stand Pt Will  Transfer to Toilet: with supervision;ambulating Pt Will Perform Toileting - Clothing Manipulation and hygiene: with supervision;sitting/lateral leans Additional ADL Goal #1: Pt will perform bed mobility prior to engaging in ADL at supervision level  Plan Discharge plan remains appropriate    Co-evaluation                 AM-PAC OT "6 Clicks" Daily Activity     Outcome Measure   Help from another person eating meals?: A Little Help from another person taking care of personal grooming?: A Little Help from another person toileting, which includes using toliet, bedpan, or urinal?: A Lot Help from another person bathing (including  washing, rinsing, drying)?: A Lot Help from another person to put on and taking off regular upper body clothing?: A Little Help from another person to put on and taking off regular lower body clothing?: A Lot 6 Click Score: 15    End of Session    OT Visit Diagnosis: Unsteadiness on feet (R26.81);Muscle weakness (generalized) (M62.81);Pain;Other symptoms and signs involving cognitive function;History of falling (Z91.81) Pain - Right/Left: Left Pain - part of body:  (L ribs)   Activity Tolerance Patient tolerated treatment well;Patient limited by pain   Patient Left in bed;with call bell/phone within reach;with bed alarm set   Nurse Communication Mobility status        Time: 1610-9604 OT Time Calculation (min): 27 min  Charges: OT Treatments $Self Care/Home Management : 23-37 mins Governor Specking OT/L  Denice Paradise 11/15/2022, 4:50 PM

## 2022-11-16 LAB — GLUCOSE, CAPILLARY
Glucose-Capillary: 124 mg/dL — ABNORMAL HIGH (ref 70–99)
Glucose-Capillary: 118 mg/dL — ABNORMAL HIGH (ref 70–99)
Glucose-Capillary: 161 mg/dL — ABNORMAL HIGH (ref 70–99)
Glucose-Capillary: 95 mg/dL (ref 70–99)

## 2022-11-16 MED ORDER — BISACODYL 10 MG RE SUPP
10.0000 mg | Freq: Once | RECTAL | Status: AC
  Administered 2022-11-16: 10 mg via RECTAL
  Filled 2022-11-16: qty 1

## 2022-11-16 MED ORDER — POLYETHYLENE GLYCOL 3350 17 G PO PACK
17.0000 g | PACK | Freq: Two times a day (BID) | ORAL | Status: DC
  Administered 2022-11-16 – 2022-11-17 (×3): 17 g via ORAL
  Filled 2022-11-16 (×3): qty 1

## 2022-11-16 MED ORDER — TRAMADOL HCL 50 MG PO TABS
50.0000 mg | ORAL_TABLET | Freq: Four times a day (QID) | ORAL | Status: DC
  Administered 2022-11-16 – 2022-11-17 (×5): 50 mg via ORAL
  Filled 2022-11-16 (×5): qty 1

## 2022-11-16 MED ORDER — OXYCODONE HCL 5 MG PO TABS: 2.5000 mg | ORAL_TABLET | ORAL | Status: DC | PRN

## 2022-11-16 MED ORDER — METHOCARBAMOL 500 MG PO TABS
1000.0000 mg | ORAL_TABLET | Freq: Three times a day (TID) | ORAL | Status: DC
  Administered 2022-11-16 – 2022-11-17 (×4): 1000 mg via ORAL
  Filled 2022-11-16 (×4): qty 2

## 2022-11-16 MED ORDER — ENOXAPARIN SODIUM 30 MG/0.3ML IJ SOSY
30.0000 mg | PREFILLED_SYRINGE | Freq: Two times a day (BID) | INTRAMUSCULAR | Status: DC
  Filled 2022-11-16 (×2): qty 0.3

## 2022-11-16 NOTE — Care Management Important Message (Signed)
Important Message  Patient Details  Name: TAYLEN JOINER MRN: [id number] Date of Birth: [birth year]   Medicare Important Message Given:  Yes     Sherilyn Banker 11/16/2022, 12:42 PM

## 2022-11-16 NOTE — TOC Progression Note (Addendum)
Transition of Care [facility]) - Progression Note    Patient Details  Name: Yvonne Peterson MRN: [id number] Date of Birth: 06-09-1946  [facility] CM/SW Contact  Astrid Drafts Berna Spare, RN Phone Number: 11/16/2022, 3:55 PM  Clinical Narrative:    Met with the patient to present bed offers for SNF; patient states she was able to ambulate to and from bathroom without any problem, and feels she can go home.  She states she really does not want to go to a skilled nursing facility.  She states she has multiple neighbors to provide assistance at home, and she has all needed DME from her husband's previous illness.  We discussed possible discharge tomorrow after working with therapies, and patient is agreeable to this plan.  She states she will be more participatory with PT/OT.  Updated Trauma PA.  Will follow up with patient on Friday.   Expected Discharge Plan: Home w Home Health Services Barriers to Discharge: Continued Medical Work up  Expected Discharge Plan and Services   Discharge Planning Services: CM Consult Post Acute Care Choice: Home Health Living arrangements for the past 2 months: Single Family Home                                       Social Determinants of Health (SDOH) Interventions SDOH Screenings   Tobacco Use: Low Risk  (11/13/2022)    Readmission Risk Interventions     No data to display         Quintella Baton, RN, BSN  Trauma/Neuro ICU Case Manager [phone]

## 2022-11-16 NOTE — Discharge Summary (Addendum)
Physician Discharge Summary  Patient ID: Yvonne Peterson MRN: [id number] DOB/AGE: [birth year] 79 y.o.  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 cyst [N28.1] AKI (acute kidney injury) (HCC) [N17.9] Multiple rib fractures [S22.49XA] Closed fracture of multiple ribs of left side, initial encounter [S22.42XA]  Discharge Diagnoses Mechanical fall 5/25 and 6/2 Left 3-9 rib fractures, flail chest of 7-9 rib fractures  Trace Left hydropneumothorax  Left gluteal hematoma  Left shoulder pain Hypertension OSA on CPAP  Bipolar affective disorder AKI vs CKD of indeterminate stage  Cystic lesion in the tail of the pancreas Left thyroid nodule Left renal lesion  Consultants none  Procedures none  HPI: 77 year old female with medical history significant for type II DM, hypertension, cardiomegaly, OSA on CPAP, bipolar, obesity who presented to [facility] ED 6/2 with left-sided thoracic pain, left shoulder pain, abdominal discomfort.  Pain began after a mechanical fall at ~7pm on date of presentation to ED.  She states she slipped and fell against a countertop striking her left ribs before she slowly lowered herself to the ground.  She has associated shortness of breath.  She denied head trauma or LOC. No associated dizziness, lightheadedness, visual changes, n/t/w of the extremities before the fall. She did report she had a mechanical fall on 5/25 as well and has been having HA and nausea since that time. She did not seek medical attention/workup after the fall on 5/25. Denies emesis, RUE pain, lower extremity pain, abdominal pain, numbness or weakness in extremities. She denies pain in the LUE but rather has pain in her left ribs with movement of the LUE.    She underwent workup in the ED with findings of multiple left-sided rib fractures with flail chest and trace left hydropneumothorax.  Trauma service consulted for  admission inpatient transferred to [facility] Course:   Patient was admitted to the trauma service for further evaluation and treatment as below:  Mechanical fall 5/25 and 6/2 L 3-9 rib fxs, flail chest of 7-9 rib fxs - multimodal pain control with aggressive pulmonary toilet with IS provided during admission. She required supplemental O2 during admission but this was abel to be weaned prior to discharge Trace L hydropneumothorax - seen on imaging at admission and resolved on follow up imaging. Treated with pulmonary toilet as above L gluteal hematoma - pain control and ice during admission. Hemoglobin was stable at time of discharge L shoulder pain - she had left shoulder pain after her fall with xray negative for acute fracture. Pain control medications during admission.  type II DM - present on admission. SSI during admission HTN -present on admission. home regimen ordered OSA on CPAP -present on admission. CPAP nightly Bipolar affective disorder - present on admission. Stable during admission AKI vs CKD of indeterminate stage - creatinine stabilized during admission but higher than outpatient presumed baseline. Recommend follow up with PCP for repeat labs as outpatient.  Incidental findings - Cystic lesion in the tail of the pancreas, left thyroid nodule, left renal lesion. Recommend outpatient follow up with PCP for further imaging as is appropriate  On date of discharge patient had appropriately progressed with therapies who recommended discharge to [facility]. This was arranged by CM. Patient reports support from neighbors and friends.   I or a member of my team have reviewed this patient in the Controlled Substance Database.  Follow up as noted below.   Allergies as of 11/17/2022  Reactions   Sulfa Antibiotics Anaphylaxis, Swelling   Swelling in nasal passage   Gabapentin         Medication List     STOP taking these medications    predniSONE 20 MG  tablet Commonly known as: DELTASONE       TAKE these medications    acetaminophen 500 MG tablet Commonly known as: TYLENOL Take 2 tablets (1,000 mg total) by mouth every 8 (eight) hours as needed.   amLODipine 5 MG tablet Commonly known as: NORVASC Take 5 mg by mouth daily.   cetirizine 10 MG tablet Commonly known as: ZYRTEC Take 10 mg by mouth daily.   diphenhydrAMINE 25 MG tablet Commonly known as: Benadryl Take 1 tablet (25 mg total) by mouth every 6 (six) hours as needed for itching (Rash).   docusate sodium 100 MG capsule Commonly known as: COLACE Take 1 capsule (100 mg total) by mouth 2 (two) times daily as needed for mild constipation.   famotidine 20 MG tablet Commonly known as: Pepcid Take 1 tablet (20 mg total) by mouth 2 (two) times daily.   IRON PO Take 1 tablet by mouth daily.   losartan 100 MG tablet Commonly known as: COZAAR Take 100 mg by mouth daily.   magnesium 30 MG tablet Take 30 mg by mouth daily.   methocarbamol 500 MG tablet Commonly known as: ROBAXIN Take 2 tablets (1,000 mg total) by mouth every 8 (eight) hours as needed for muscle spasms.   polyethylene glycol 17 g packet Commonly known as: MIRALAX / GLYCOLAX Take 17 g by mouth daily as needed for mild constipation.   traMADol 50 MG tablet Commonly known as: ULTRAM Take 1 tablet (50 mg total) by mouth every 6 (six) hours as needed for severe pain.   Vitamin B Complex Tabs Take 1 tablet by mouth daily.   VITAMIN D-VITAMIN K PO Take 1 tablet by mouth daily.   ZINC PO Take 1 tablet by mouth daily.          Follow-up Information     Thayer Headings, MD. Schedule an appointment as soon as possible for a visit.   Specialty: Internal Medicine Why: Please call to follow up with your PCP after admission. Your imaging showed several incidental findings to review with your PCP: Cystic lesion in the tail of the pancreas, left thyroid nodule, left renal lesion. Contact  information: [address] [place] [address] [phone]         [facility]. Call.   Why: As needed Contact information: [address] [place] [address] [phone]        [facility] Follow up.   Why: Phone:  [phone]  Agency will call you to arrange home physical and occupational therapy visits.                Signed: Jacinto Halim, [facility] 11/17/2022, 2:21 PM Please see Amion for pager number during day hours 7:00am-4:30pm

## 2022-11-16 NOTE — Progress Notes (Signed)
Arrived to patient's room. Provider at bedside. Notified nurse to place consult when patient is available for PIV start. Secure chat sent. Tomasita Morrow, Rn VAST

## 2022-11-16 NOTE — Progress Notes (Signed)
Physical Therapy Treatment Patient Details Name: Yvonne Peterson MRN: [id number] DOB: February 19, 1946 Today's Date: 11/16/2022   History of Present Illness Yvonne Peterson is a 77 y/o F presented to [facility] ED 6/2 with left-sided thoracic pain, left shoulder pain, abdominal discomfort. She had a mechanical fall 6/2 striking her left ribs before she slowly lowered herself to the ground, denied head trauma or LOC. She did report she had a mechanical fall on 5/25 as well and has been having HA and nausea. CT of head an C Spine negative for acute abnormalities. PMH includes type II DM, hypertension, cardiomegaly, OSA on CPAP, bipolar, obesity    PT Comments    Pt received in supine and agreeable to session with encouragement. Pt able to perform bed mobility with mod A this session with cues for technique. Pt reporting decreased pain since injury, however continues to be limited by pain. Pt reporting increased nausea once sitting EOB. Pt able to perform seated exercises, however then requests to return to supine. Pt able to tolerate supine BLE exercises for strengthening, but declines OOB mobility this session. Pt continues to benefit from PT services to progress toward functional mobility goals.     Recommendations for follow up therapy are one component of a multi-disciplinary discharge planning process, led by the attending physician.  Recommendations may be updated based on patient status, additional functional criteria and insurance authorization.     Assistance Recommended at Discharge Frequent or constant Supervision/Assistance  Patient can return home with the following Two people to help with walking and/or transfers;Two people to help with bathing/dressing/bathroom;Assistance with cooking/housework;Assistance with feeding;Direct supervision/assist for medications management;Assist for transportation;Help with stairs or ramp for entrance   Equipment Recommendations  None recommended by PT     Recommendations for Other Services       Precautions / Restrictions Precautions Precautions: Fall Precaution Comments: admitted as result of fall 2 falls in last 2 weeks results of LoB, Restrictions Weight Bearing Restrictions: No     Mobility  Bed Mobility Overal bed mobility: Needs Assistance Bed Mobility: Sit to Supine, Supine to Sit     Supine to sit: Mod assist Sit to supine: Mod assist   General bed mobility comments: Mod A for trunk elevation and total A with bedpad to scoot forward to EOB. Mod A for BLE elevation to EOB to return to supine and to scoot towards HOB.    Transfers                   General transfer comment: Pt declined due to nausea       Balance Overall balance assessment: Needs assistance Sitting-balance support: Feet supported, Bilateral upper extremity supported Sitting balance-Leahy Scale: Fair Sitting balance - Comments: Pt initially requiring min A to maintain balance progressing to min guard       Standing balance comment: not tested                            Cognition Arousal/Alertness: Awake/alert Behavior During Therapy: WFL for tasks assessed/performed, Anxious Overall Cognitive Status: Within Functional Limits for tasks assessed                                          Exercises General Exercises - Lower Extremity Quad Sets: AROM, Supine, Both, 5 reps Long Arc Quad: AROM, Seated, Both, 10  reps Heel Slides: AROM, Supine, Both, 10 reps Straight Leg Raises: AROM, Supine, Both, 10 reps    General Comments        Pertinent Vitals/Pain Pain Assessment Pain Assessment: Faces Faces Pain Scale: Hurts even more Pain Location: L ribs with movement Pain Descriptors / Indicators: Grimacing, Guarding, Moaning Pain Intervention(s): Limited activity within patient's tolerance, Monitored during session, Repositioned     PT Goals (current goals can now be found in the care plan section) Acute  Rehab PT Goals PT Goal Formulation: With patient Time For Goal Achievement: 11/28/22 Potential to Achieve Goals: Fair Progress towards PT goals: Progressing toward goals (slowly)    Frequency    Min 3X/week      PT Plan Current plan remains appropriate       AM-PAC PT "6 Clicks" Mobility   Outcome Measure  Help needed turning from your back to your side while in a flat bed without using bedrails?: A Lot Help needed moving from lying on your back to sitting on the side of a flat bed without using bedrails?: A Lot Help needed moving to and from a bed to a chair (including a wheelchair)?: A Lot Help needed standing up from a chair using your arms (e.g., wheelchair or bedside chair)?: A Lot Help needed to walk in hospital room?: A Lot Help needed climbing 3-5 steps with a railing? : Total 6 Click Score: 11    End of Session   Activity Tolerance: Treatment limited secondary to medical complications (Comment) (nausea) Patient left: in bed;with call bell/phone within reach Nurse Communication: Mobility status PT Visit Diagnosis: Unsteadiness on feet (R26.81);Repeated falls (R29.6);Other abnormalities of gait and mobility (R26.89);Muscle weakness (generalized) (M62.81);History of falling (Z91.81);Difficulty in walking, not elsewhere classified (R26.2)     Time: 1610-9604 PT Time Calculation (min) (ACUTE ONLY): 20 min  Charges:  $Therapeutic Exercise: 8-22 mins                    Johny Shock, PTA Acute Rehabilitation Services Secure Chat Preferred  [phone]    Johny Shock 11/16/2022, 9:03 AM

## 2022-11-16 NOTE — Progress Notes (Signed)
   11/15/22 2000  BiPAP/CPAP/SIPAP  $ Non-Invasive Ventilator   (Refused)  FiO2 (%) 21 %  BiPAP/CPAP /SiPAP Vitals  SpO2 96 %

## 2022-11-16 NOTE — Progress Notes (Signed)
   11/15/22 2000  BiPAP/CPAP/SIPAP  $ Non-Invasive Ventilator   (Refused)

## 2022-11-16 NOTE — Progress Notes (Signed)
Subjective: CC: Continued left rib pain, worse with movement. No sob. Improved to 1000 on IS. No other areas of pain. No longer requiring IV pain meds. Also didn't utilize oxy after 1004am yesterday. Reports if she sits still she has no pain but hesitant to move 2/2 the pain. Did mobilize with mobility specialist yesterday - 134ft w/ RW. Tolerating po without n/v. Passing flatus. Voiding.   Afebrile. No tachycardia or hypotension. On 2L.   Objective: Vital signs in last 24 hours: Temp:  [97.6 F (36.4 C)-98.8 F (37.1 C)] 97.6 F (36.4 C) (06/06 0545) Pulse Rate:  [61-65] 63 (06/06 0545) Resp:  [16-17] 17 (06/06 0545) BP: (147-163)/(58-84) 163/58 (06/06 0545) SpO2:  [94 %-96 %] 96 % (06/06 0545) FiO2 (%):  [21 %] 21 % (06/05 2000) Last BM Date : 11/14/22  Intake/Output from previous day: No intake/output data recorded. Intake/Output this shift: No intake/output data recorded.  PE: Gen:  Alert, NAD, pleasant Card:  Reg Pulm:  CTAB, no W/R/R, effort normal. On o2. Pulling 1000 on IS.  Abd: Soft, ND, NT, +BS Ext:  No LE edema. MAE's Psych: A&Ox3   Lab Results:  Recent Labs    11/14/22 0049 11/15/22 0044  WBC 6.9 7.9  HGB 10.5* 11.2*  HCT 32.8* 35.5*  PLT 132* 141*    BMET Recent Labs    11/14/22 0049 11/15/22 0044  NA 136 136  K 4.6 4.8  CL 106 107  CO2 23 21*  GLUCOSE 113* 106*  BUN 28* 28*  CREATININE 1.89* 1.89*  CALCIUM 8.7* 8.8*    PT/INR No results for input(s): "LABPROT", "INR" in the last 72 hours. CMP     Component Value Date/Time   NA 136 11/15/2022 0044   K 4.8 11/15/2022 0044   CL 107 11/15/2022 0044   CO2 21 (L) 11/15/2022 0044   GLUCOSE 106 (H) 11/15/2022 0044   BUN 28 (H) 11/15/2022 0044   CREATININE 1.89 (H) 11/15/2022 0044   CALCIUM 8.8 (L) 11/15/2022 0044   PROT 7.0 11/12/2022 2237   ALBUMIN 3.6 11/12/2022 2237   AST 35 11/12/2022 2237   ALT 25 11/12/2022 2237   ALKPHOS 44 11/12/2022 2237   BILITOT 1.3 (H) 11/12/2022  2237   GFRNONAA 27 (L) 11/15/2022 0044   GFRAA >90 09/12/2013 1252   Lipase     Component Value Date/Time   LIPASE 138 (H) 09/10/2013 1340    Studies/Results: No results found.  Anti-infectives: Anti-infectives (From admission, onward)    None        Assessment/Plan Mechanical fall 5/25 and 6/2 L 3-9 rib fxs, flail chest of 7-9 rib fxs - multimodal pain control with aggressive pulm toilet with IS. Supplemental O2 as needed and wean as able Trace L hydropneumothorax - not seen on CXR 6/3, treatment as above L gluteal hematoma - ice prn, multimodal pain control. Hgb stable.  L shoulder pain - shoulder xray with arthritic changes. No acute fracture. Pain control type II DM - SSI HTN - home regimen ordered OSA on CPAP - CPAP QHS Bipolar affective disorder AKI vs CKD of indeterminate stage - Cr 1.92 > 1.89 > 1.89. Baseline 1.55 on 07/12/20. Question if this is new baseline as now stable. Voiding ok. F/u PCP for repeat labs as outpatient.  Incidental findings - Cystic lesion in the tail of the pancreas, left thyroid nodule, left renal lesion. Recommend outpatient follow up with PCP for further imaging as is appropriate FEN -  Reg diet, SLIV, increase bowel regimen.  ID - none needed VTE - Lovenox, SCDs Foley - none Dispo - Therapies. SNF. Adjust pain meds. Medically stable for d/c when bed available.   I reviewed nursing notes, last 24 h vitals and pain scores, last 48 h intake and output, last 24 h labs and trends, and last 24 h imaging results.   LOS: 3 days    Jacinto Halim , [facility] 11/16/2022, 7:49 AM Please see Amion for pager number during day hours 7:00am-4:30pm

## 2022-11-17 ENCOUNTER — Other Ambulatory Visit (HOSPITAL_COMMUNITY): Payer: Self-pay

## 2022-11-17 LAB — GLUCOSE, CAPILLARY
Glucose-Capillary: 137 mg/dL — ABNORMAL HIGH (ref 70–99)
Glucose-Capillary: 105 mg/dL — ABNORMAL HIGH (ref 70–99)

## 2022-11-17 MED ORDER — POLYETHYLENE GLYCOL 3350 17 G PO PACK: 17.0000 g | PACK | Freq: Every day | ORAL | 0 refills | Status: AC | PRN

## 2022-11-17 MED ORDER — METHOCARBAMOL 500 MG PO TABS
1000.0000 mg | ORAL_TABLET | Freq: Three times a day (TID) | ORAL | 0 refills | Status: AC | PRN
  Filled 2022-11-17: qty 30, 5d supply, fill #0

## 2022-11-17 MED ORDER — DOCUSATE SODIUM 100 MG PO CAPS: 100.0000 mg | ORAL_CAPSULE | Freq: Two times a day (BID) | ORAL | 0 refills | Status: AC | PRN

## 2022-11-17 MED ORDER — TRAMADOL HCL 50 MG PO TABS
50.0000 mg | ORAL_TABLET | Freq: Four times a day (QID) | ORAL | 0 refills | Status: AC | PRN
  Filled 2022-11-17: qty 15, 4d supply, fill #0

## 2022-11-17 MED ORDER — ACETAMINOPHEN 500 MG PO TABS: 1000.0000 mg | ORAL_TABLET | Freq: Three times a day (TID) | ORAL | 0 refills | Status: AC | PRN

## 2022-11-17 NOTE — Progress Notes (Signed)
Subjective: CC: Left rib pain much better controlled after adjustments yesterday. Reports she is now able to get oob herself and mobilize with walker. Wants to go home with [facility] if possible. Has support from several neighbors. I have asked PT/OT to reassess.   No sob. Using IS and pulling 1000. No other areas of pain. No longer requiring IV pain meds. Tolerating po without n/v. Bm yesterday. Voiding.   Afebrile. No tachycardia or hypotension. Weaned to RA. No I/O recorded.   Objective: Vital signs in last 24 hours: Temp:  [98 F (36.7 C)-98.9 F (37.2 C)] 98.9 F (37.2 C) (06/07 0741) Pulse Rate:  [66-80] 71 (06/07 0741) Resp:  [16] 16 (06/07 0741) BP: (143-192)/(83-94) 181/94 (06/07 0741) SpO2:  [92 %-95 %] 94 % (06/07 0741) Last BM Date : 11/16/22 (Patient self reported)  Intake/Output from previous day: No intake/output data recorded. Intake/Output this shift: No intake/output data recorded.  PE: Gen:  Alert, NAD, pleasant Card:  Reg Pulm:  CTAB, no W/R/R, effort normal. On RA. Pulling 1000 on IS.  Abd: Soft, ND, NT, +BS Ext:  No LE edema. MAE's Psych: A&Ox3   Lab Results:  Recent Labs    11/15/22 0044  WBC 7.9  HGB 11.2*  HCT 35.5*  PLT 141*    BMET Recent Labs    11/15/22 0044  NA 136  K 4.8  CL 107  CO2 21*  GLUCOSE 106*  BUN 28*  CREATININE 1.89*  CALCIUM 8.8*    PT/INR No results for input(s): "LABPROT", "INR" in the last 72 hours. CMP     Component Value Date/Time   NA 136 11/15/2022 0044   K 4.8 11/15/2022 0044   CL 107 11/15/2022 0044   CO2 21 (L) 11/15/2022 0044   GLUCOSE 106 (H) 11/15/2022 0044   BUN 28 (H) 11/15/2022 0044   CREATININE 1.89 (H) 11/15/2022 0044   CALCIUM 8.8 (L) 11/15/2022 0044   PROT 7.0 11/12/2022 2237   ALBUMIN 3.6 11/12/2022 2237   AST 35 11/12/2022 2237   ALT 25 11/12/2022 2237   ALKPHOS 44 11/12/2022 2237   BILITOT 1.3 (H) 11/12/2022 2237   GFRNONAA 27 (L) 11/15/2022 0044   GFRAA >90 09/12/2013  1252   Lipase     Component Value Date/Time   LIPASE 138 (H) 09/10/2013 1340    Studies/Results: No results found.  Anti-infectives: Anti-infectives (From admission, onward)    None        Assessment/Plan Mechanical fall 5/25 and 6/2 L 3-9 rib fxs, flail chest of 7-9 rib fxs - multimodal pain control with aggressive pulm toilet with IS. Weaned to RA.  Trace L hydropneumothorax - not seen on CXR 6/3, treatment as above L gluteal hematoma - ice prn, multimodal pain control. Hgb stable on last check. HDS.  L shoulder pain - shoulder xray with arthritic changes. No acute fracture. Pain control type II DM - SSI HTN - home regimen ordered OSA on CPAP - CPAP QHS Bipolar affective disorder AKI vs CKD of indeterminate stage - Cr 1.92 > 1.89 > 1.89. Baseline 1.55 on 07/12/20. Question if this is new baseline as now stable. Voiding ok. F/u PCP for repeat labs as outpatient.  Incidental findings - Cystic lesion in the tail of the pancreas, left thyroid nodule, left renal lesion. Recommend outpatient follow up with PCP for further imaging as is appropriate FEN - Reg diet, SLIV, cont bowel regimen.  ID - none needed VTE - Lovenox, SCDs  Foley - none Dispo - Therapies. SNF vs HH. Reassess with PT/OT today.   I reviewed nursing notes, last 24 h vitals and pain scores, last 48 h intake and output, last 24 h labs and trends, and last 24 h imaging results.   LOS: 4 days    Jacinto Halim , [facility] 11/17/2022, 8:02 AM Please see Amion for pager number during day hours 7:00am-4:30pm

## 2022-11-17 NOTE — TOC Transition Note (Signed)
Transition of Care [facility]) - CM/SW Discharge Note   Patient Details  Name: JAIMY KLIETHERMES MRN: [id number] Date of Birth: 1946-01-25  Transition of Care [facility]) CM/SW Contact:  Glennon Mac, RN Phone Number: 11/17/2022, 2:17 PM   Clinical Narrative:    Patient met close stable for discharge home today with assist of neighbors and friends.  PT/OT recommending home health follow-up, and patient is agreeable to continued therapies.  Referral to [facility] home health for follow-up.  Patient has all needed DME at home.   Final next level of care: Home w Home Health Services Barriers to Discharge: Barriers Resolved                         Discharge Plan and Services Additional resources added to the After Visit Summary for     Discharge Planning Services: CM Consult Post Acute Care Choice: Home Health                    HH Arranged: PT, OT [facility] Agency: [facility] Date [facility] Agency Contacted: 11/17/22 Time HH Agency Contacted: 1155 Representative spoke with at [facility] Agency: Marylene Land  Social Determinants of Health (SDOH) Interventions SDOH Screenings   Tobacco Use: Low Risk  (11/13/2022)     Readmission Risk Interventions     No data to display         Quintella Baton, RN, BSN  Trauma/Neuro ICU Case Manager [phone]

## 2022-11-17 NOTE — Progress Notes (Signed)
Physical Therapy Treatment Patient Details Name: Yvonne Peterson MRN: [id number] DOB: 1945/07/18 Today's Date: 11/17/2022   History of Present Illness Temprance Wyre is a 77 y/o F presented to [facility] ED 6/2 with left-sided thoracic pain, left shoulder pain, abdominal discomfort. She had a mechanical fall 6/2 striking her left ribs before she slowly lowered herself to the ground, denied head trauma or LOC. She did report she had a mechanical fall on 5/25 as well and has been having HA and nausea. CT of head an C Spine negative for acute abnormalities. PMH includes type II DM, hypertension, cardiomegaly, OSA on CPAP, bipolar, obesity    PT Comments    Pt received in supine and agreeable to session. Pt reporting improved pain and making good progress towards mobility goals. Pt able to perform bed mobility and transfers demonstrating good technique with supervision for safety. Pt able to tolerate increased gait distance demonstrating some intermittent unsteadiness, however is able to maintain balance with RW support. Pt reports being comfortable with navigating the 1 small step to enter her home and discussed technique. After discussion with supervising PT, Logan B., discharge recommendations have been updated to HHPT. Anticipate pt will be able to manage mobility needs at home when medically ready for discharge. Acutely, pt continues to benefit from PT services to progress toward functional mobility goals.     Recommendations for follow up therapy are one component of a multi-disciplinary discharge planning process, led by the attending physician.  Recommendations may be updated based on patient status, additional functional criteria and insurance authorization.  Follow Up Recommendations  Can patient physically be transported by private vehicle: Yes    Assistance Recommended at Discharge Frequent or constant Supervision/Assistance  Patient can return home with the following Two people to help with  walking and/or transfers;Two people to help with bathing/dressing/bathroom;Assistance with cooking/housework;Assistance with feeding;Direct supervision/assist for medications management;Assist for transportation;Help with stairs or ramp for entrance   Equipment Recommendations  None recommended by PT    Recommendations for Other Services       Precautions / Restrictions Precautions Precautions: Fall Precaution Comments: admitted as result of fall 2 falls in last 2 weeks results of LoB, Restrictions Weight Bearing Restrictions: No     Mobility  Bed Mobility Overal bed mobility: Needs Assistance Bed Mobility: Sit to Supine, Supine to Sit     Supine to sit: Supervision, HOB elevated Sit to supine: Supervision, HOB elevated   General bed mobility comments: Pt able to sit EOB with use of bed features and increased time    Transfers Overall transfer level: Needs assistance Equipment used: Rolling walker (2 wheels) Transfers: Sit to/from Stand Sit to Stand: Supervision           General transfer comment: Pt demonstrating safe hand placement and steady power up from low EOB    Ambulation/Gait Ambulation/Gait assistance: Min guard, Supervision Gait Distance (Feet): 175 Feet Assistive device: Rolling walker (2 wheels) Gait Pattern/deviations: Step-through pattern, Narrow base of support       General Gait Details: Pt demonstrating step-through pattern with a NBOS and occasional scissoring when turning head causing increased unsteadiness, but no LOB.      Balance Overall balance assessment: Needs assistance Sitting-balance support: Feet supported, Bilateral upper extremity supported Sitting balance-Leahy Scale: Good Sitting balance - Comments: sitting EOB   Standing balance support: Bilateral upper extremity supported, During functional activity, Reliant on assistive device for balance Standing balance-Leahy Scale: Fair Standing balance comment: with RW support  Cognition Arousal/Alertness: Awake/alert Behavior During Therapy: WFL for tasks assessed/performed, Anxious Overall Cognitive Status: Within Functional Limits for tasks assessed                                          Exercises      General Comments General comments (skin integrity, edema, etc.): Pt's SpO2 on RA at the beginning of session 95% and after ambulation 91% quickly improving to 95%      Pertinent Vitals/Pain Pain Assessment Pain Assessment: No/denies pain     PT Goals (current goals can now be found in the care plan section) Acute Rehab PT Goals PT Goal Formulation: With patient Time For Goal Achievement: 11/28/22 Potential to Achieve Goals: Fair Progress towards PT goals: Progressing toward goals    Frequency    Min 3X/week      PT Plan Discharge plan needs to be updated       AM-PAC PT "6 Clicks" Mobility   Outcome Measure  Help needed turning from your back to your side while in a flat bed without using bedrails?: None Help needed moving from lying on your back to sitting on the side of a flat bed without using bedrails?: None Help needed moving to and from a bed to a chair (including a wheelchair)?: A Little Help needed standing up from a chair using your arms (e.g., wheelchair or bedside chair)?: A Little Help needed to walk in hospital room?: A Little Help needed climbing 3-5 steps with a railing? : A Little 6 Click Score: 20    End of Session   Activity Tolerance: Patient tolerated treatment well Patient left: in bed;with call bell/phone within reach Nurse Communication: Mobility status PT Visit Diagnosis: Unsteadiness on feet (R26.81);Repeated falls (R29.6);Other abnormalities of gait and mobility (R26.89);Muscle weakness (generalized) (M62.81);History of falling (Z91.81);Difficulty in walking, not elsewhere classified (R26.2)     Time: 6045-4098 PT Time Calculation (min) (ACUTE ONLY):  15 min  Charges:  $Gait Training: 8-22 mins                     Johny Shock, PTA Acute Rehabilitation Services Secure Chat Preferred  [phone]    Johny Shock 11/17/2022, 10:01 AM

## 2022-11-17 NOTE — Progress Notes (Signed)
Russ Halo to be D/C'd  per MD order.  Discussed with the patient and all questions fully answered.  VSS, Skin clean, dry and intact without evidence of skin break down, no evidence of skin tears noted.  IV catheter discontinued last night. Site without signs and symptoms of complications. Dressing and pressure applied.  An After Visit Summary was printed and given to the patient. Patient received prescription from [facility] pharmacy.  D/c education completed with patient/family including follow up instructions, medication list, d/c activities limitations if indicated, with other d/c instructions as indicated by MD - patient able to verbalize understanding, all questions fully answered.   Patient instructed to return to ED, call 911, or call MD for any changes in condition.   Patient to be escorted via WC, and D/C home via private auto.

## 2023-04-06 DIAGNOSIS — S8001XA Contusion of right knee, initial encounter: Secondary | ICD-10-CM | POA: Diagnosis not present

## 2023-04-06 DIAGNOSIS — W1830XA Fall on same level, unspecified, initial encounter: Secondary | ICD-10-CM | POA: Diagnosis not present

## 2023-04-06 DIAGNOSIS — M25521 Pain in right elbow: Secondary | ICD-10-CM | POA: Diagnosis not present

## 2023-04-06 DIAGNOSIS — M25561 Pain in right knee: Secondary | ICD-10-CM | POA: Diagnosis not present

## 2023-04-20 DIAGNOSIS — Z09 Encounter for follow-up examination after completed treatment for conditions other than malignant neoplasm: Secondary | ICD-10-CM | POA: Diagnosis not present

## 2023-04-28 DIAGNOSIS — M25552 Pain in left hip: Secondary | ICD-10-CM | POA: Diagnosis not present

## 2023-04-30 DIAGNOSIS — M7062 Trochanteric bursitis, left hip: Secondary | ICD-10-CM | POA: Diagnosis not present

## 2023-04-30 DIAGNOSIS — Z96642 Presence of left artificial hip joint: Secondary | ICD-10-CM | POA: Diagnosis not present

## 2023-04-30 DIAGNOSIS — Z9889 Other specified postprocedural states: Secondary | ICD-10-CM | POA: Diagnosis not present

## 2023-04-30 DIAGNOSIS — M25552 Pain in left hip: Secondary | ICD-10-CM | POA: Diagnosis not present

## 2023-10-23 DIAGNOSIS — I129 Hypertensive chronic kidney disease with stage 1 through stage 4 chronic kidney disease, or unspecified chronic kidney disease: Secondary | ICD-10-CM | POA: Diagnosis not present

## 2023-10-23 DIAGNOSIS — R739 Hyperglycemia, unspecified: Secondary | ICD-10-CM | POA: Diagnosis not present

## 2023-10-23 DIAGNOSIS — E785 Hyperlipidemia, unspecified: Secondary | ICD-10-CM | POA: Diagnosis not present

## 2023-10-29 DIAGNOSIS — K625 Hemorrhage of anus and rectum: Secondary | ICD-10-CM | POA: Diagnosis not present

## 2023-10-29 DIAGNOSIS — I700 Atherosclerosis of aorta: Secondary | ICD-10-CM | POA: Diagnosis not present

## 2023-10-29 DIAGNOSIS — E1122 Type 2 diabetes mellitus with diabetic chronic kidney disease: Secondary | ICD-10-CM | POA: Diagnosis not present

## 2023-10-29 DIAGNOSIS — K862 Cyst of pancreas: Secondary | ICD-10-CM | POA: Diagnosis not present

## 2023-10-29 DIAGNOSIS — Z0000 Encounter for general adult medical examination without abnormal findings: Secondary | ICD-10-CM | POA: Diagnosis not present

## 2023-10-29 DIAGNOSIS — E039 Hypothyroidism, unspecified: Secondary | ICD-10-CM | POA: Diagnosis not present

## 2023-10-29 DIAGNOSIS — F339 Major depressive disorder, recurrent, unspecified: Secondary | ICD-10-CM | POA: Diagnosis not present

## 2023-10-29 DIAGNOSIS — I10 Essential (primary) hypertension: Secondary | ICD-10-CM | POA: Diagnosis not present

## 2023-10-29 DIAGNOSIS — F319 Bipolar disorder, unspecified: Secondary | ICD-10-CM | POA: Diagnosis not present

## 2023-10-29 DIAGNOSIS — N1832 Chronic kidney disease, stage 3b: Secondary | ICD-10-CM | POA: Diagnosis not present

## 2023-10-29 DIAGNOSIS — G4733 Obstructive sleep apnea (adult) (pediatric): Secondary | ICD-10-CM | POA: Diagnosis not present

## 2023-11-26 DIAGNOSIS — Z6832 Body mass index (BMI) 32.0-32.9, adult: Secondary | ICD-10-CM | POA: Diagnosis not present

## 2023-11-26 DIAGNOSIS — E669 Obesity, unspecified: Secondary | ICD-10-CM | POA: Diagnosis not present

## 2023-11-26 DIAGNOSIS — Z008 Encounter for other general examination: Secondary | ICD-10-CM | POA: Diagnosis not present

## 2023-11-26 DIAGNOSIS — E785 Hyperlipidemia, unspecified: Secondary | ICD-10-CM | POA: Diagnosis not present
